# Patient Record
Sex: Female | Born: 1965
Health system: Southern US, Community
[De-identification: ages and names within clinical notes are randomized; demographics above are authoritative.]

## PROBLEM LIST (undated history)

## (undated) DIAGNOSIS — K635 Polyp of colon: Secondary | ICD-10-CM

## (undated) DIAGNOSIS — Z8371 Family history of colonic polyps: Secondary | ICD-10-CM

## (undated) DIAGNOSIS — N809 Endometriosis, unspecified: Secondary | ICD-10-CM

## (undated) DIAGNOSIS — I341 Nonrheumatic mitral (valve) prolapse: Secondary | ICD-10-CM

## (undated) DIAGNOSIS — M797 Fibromyalgia: Secondary | ICD-10-CM

## (undated) DIAGNOSIS — K648 Other hemorrhoids: Secondary | ICD-10-CM

## (undated) DIAGNOSIS — Z860100 Personal history of colon polyps, unspecified: Secondary | ICD-10-CM

## (undated) DIAGNOSIS — K219 Gastro-esophageal reflux disease without esophagitis: Secondary | ICD-10-CM

## (undated) DIAGNOSIS — E063 Autoimmune thyroiditis: Secondary | ICD-10-CM

## (undated) DIAGNOSIS — I10 Essential (primary) hypertension: Secondary | ICD-10-CM

## (undated) DIAGNOSIS — K449 Diaphragmatic hernia without obstruction or gangrene: Secondary | ICD-10-CM

## (undated) DIAGNOSIS — Z8619 Personal history of other infectious and parasitic diseases: Secondary | ICD-10-CM

## (undated) DIAGNOSIS — K5790 Diverticulosis of intestine, part unspecified, without perforation or abscess without bleeding: Secondary | ICD-10-CM

## (undated) DIAGNOSIS — D893 Immune reconstitution syndrome: Secondary | ICD-10-CM

## (undated) DIAGNOSIS — K589 Irritable bowel syndrome without diarrhea: Secondary | ICD-10-CM

## (undated) DIAGNOSIS — Z83719 Family history of colon polyps, unspecified: Secondary | ICD-10-CM

## (undated) DIAGNOSIS — F419 Anxiety disorder, unspecified: Secondary | ICD-10-CM

## (undated) DIAGNOSIS — E119 Type 2 diabetes mellitus without complications: Secondary | ICD-10-CM

## (undated) HISTORY — DX: Irritable bowel syndrome, unspecified: K58.9

## (undated) HISTORY — DX: Fibromyalgia: M79.7

## (undated) HISTORY — DX: Family history of colon polyps, unspecified: Z83.719

## (undated) HISTORY — DX: Personal history of other infectious and parasitic diseases: Z86.19

## (undated) HISTORY — DX: Endometriosis, unspecified: N80.9

## (undated) HISTORY — DX: Personal history of colon polyps, unspecified: Z86.0100

## (undated) HISTORY — DX: Family history of colonic polyps: Z83.71

## (undated) HISTORY — DX: Gastro-esophageal reflux disease without esophagitis: K21.9

## (undated) HISTORY — DX: Diaphragmatic hernia without obstruction or gangrene: K44.9

## (undated) HISTORY — DX: Type 2 diabetes mellitus without complications: E11.9

## (undated) HISTORY — DX: Autoimmune thyroiditis: E06.3

## (undated) HISTORY — DX: Polyp of colon: K63.5

## (undated) HISTORY — DX: Anxiety disorder, unspecified: F41.9

## (undated) HISTORY — DX: Personal history of colonic polyps: Z86.010

## (undated) HISTORY — DX: Essential (primary) hypertension: I10

## (undated) HISTORY — DX: Other hemorrhoids: K64.8

## (undated) HISTORY — DX: Immune reconstitution syndrome: D89.3

## (undated) HISTORY — DX: Diverticulosis of intestine, part unspecified, without perforation or abscess without bleeding: K57.90

## (undated) HISTORY — DX: Nonrheumatic mitral (valve) prolapse: I34.1

---
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
## 2003-03-24 ENCOUNTER — Other Ambulatory Visit: Admission: RE | Admit: 2003-03-24 | Discharge: 2003-03-24 | Payer: Self-pay | Admitting: Obstetrics and Gynecology

## 2005-03-02 ENCOUNTER — Other Ambulatory Visit: Admission: RE | Admit: 2005-03-02 | Discharge: 2005-03-02 | Payer: Self-pay | Admitting: Obstetrics and Gynecology

## 2005-08-04 ENCOUNTER — Encounter: Admission: RE | Admit: 2005-08-04 | Discharge: 2005-08-04 | Payer: Self-pay | Admitting: Otolaryngology

## 2005-11-24 ENCOUNTER — Inpatient Hospital Stay (HOSPITAL_COMMUNITY): Admission: AD | Admit: 2005-11-24 | Discharge: 2005-11-24 | Payer: Self-pay | Admitting: Obstetrics & Gynecology

## 2005-12-12 ENCOUNTER — Ambulatory Visit (HOSPITAL_COMMUNITY): Admission: RE | Admit: 2005-12-12 | Discharge: 2005-12-12 | Payer: Self-pay | Admitting: Obstetrics and Gynecology

## 2005-12-19 ENCOUNTER — Ambulatory Visit (HOSPITAL_COMMUNITY): Admission: RE | Admit: 2005-12-19 | Discharge: 2005-12-19 | Payer: Self-pay | Admitting: Obstetrics and Gynecology

## 2005-12-24 ENCOUNTER — Inpatient Hospital Stay (HOSPITAL_COMMUNITY): Admission: AD | Admit: 2005-12-24 | Discharge: 2005-12-24 | Payer: Self-pay | Admitting: Obstetrics and Gynecology

## 2005-12-25 ENCOUNTER — Ambulatory Visit (HOSPITAL_COMMUNITY): Admission: RE | Admit: 2005-12-25 | Discharge: 2005-12-25 | Payer: Self-pay | Admitting: Obstetrics and Gynecology

## 2006-05-25 IMAGING — US US OB COMP LESS 14 WK
1 series · 14 of 28 positions shown · non-contrast
Comparison: none

CLINICAL DATA: 7 week 4 day gestational age by LMP.  Quantitative beta hCG level of [DATE].  Question blighted ovum on office ultrasound.  
OBSTETRICAL ULTRASOUND <14 WKS AND TRANSVAGINAL OB US:
TECHNIQUE: Both transabdominal and transvaginal ultrasound examinations were performed for complete evaluation of the gestation as well as the maternal uterus, adnexal regions, and pelvic cul-de-sac.

[Series 1: us ob comp less 14 wk · 0.29mm/px · 14 of 48 slices shown]
[im 2/48]
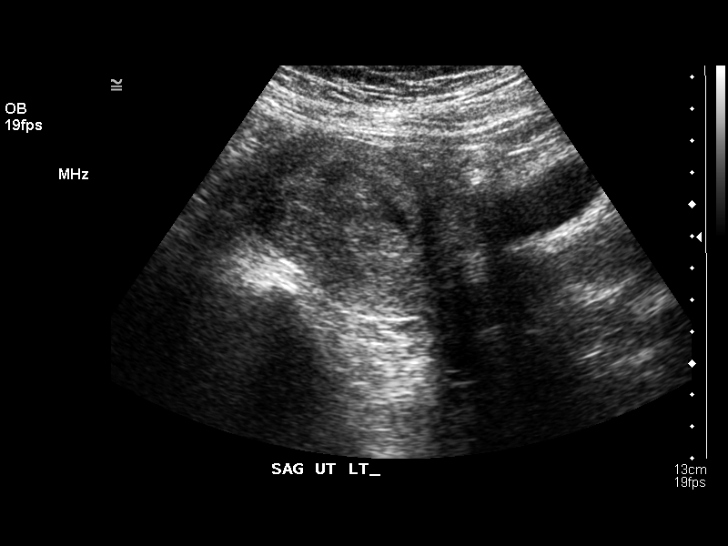
[im 6/48]
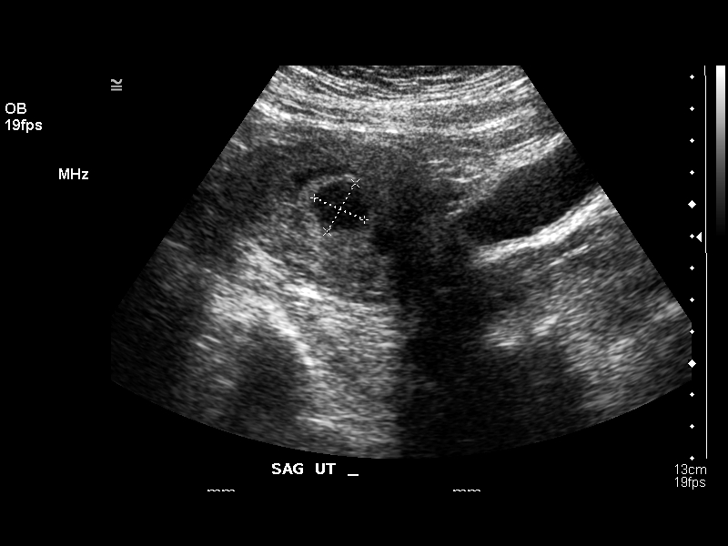
[im 9/48]
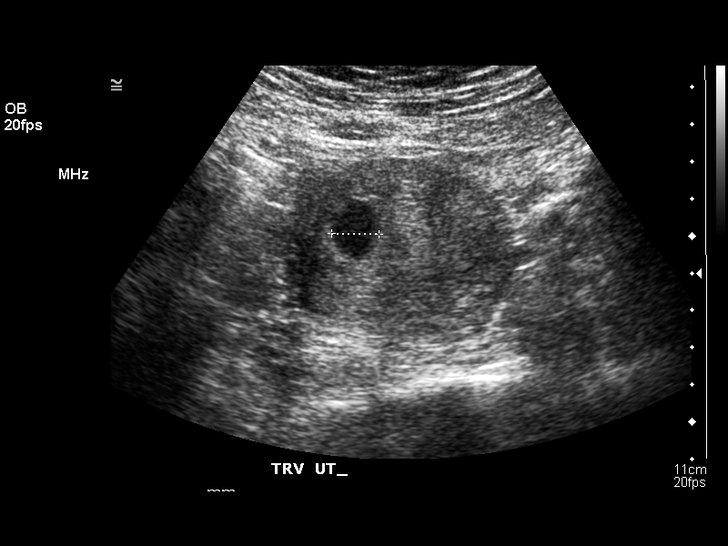
[im 13/48]
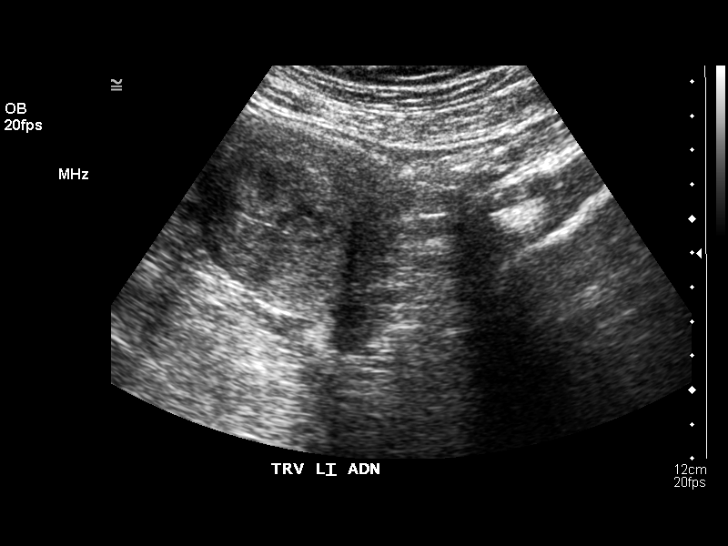
[im 16/48]
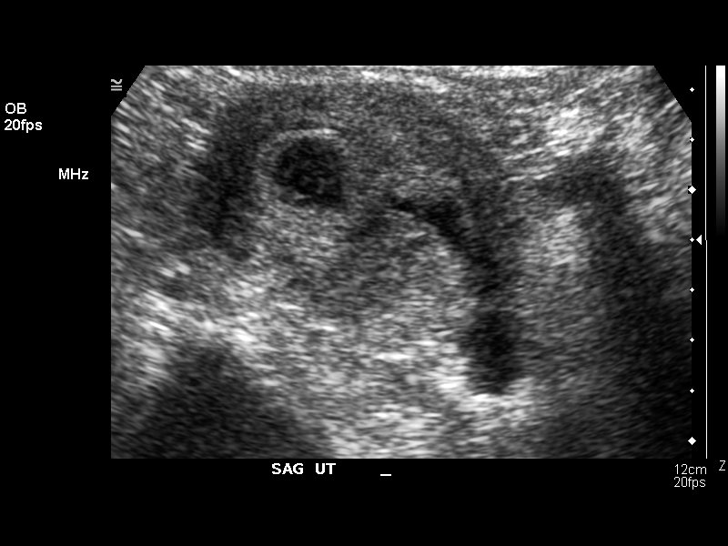
[im 20/48]
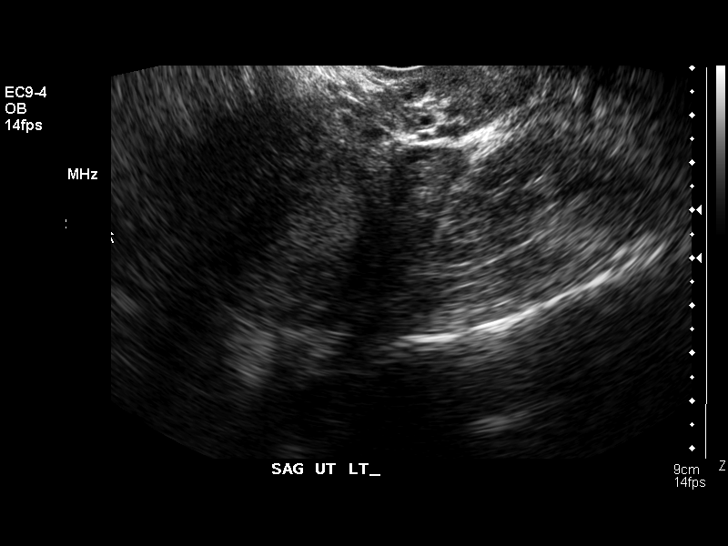
[im 23/48]
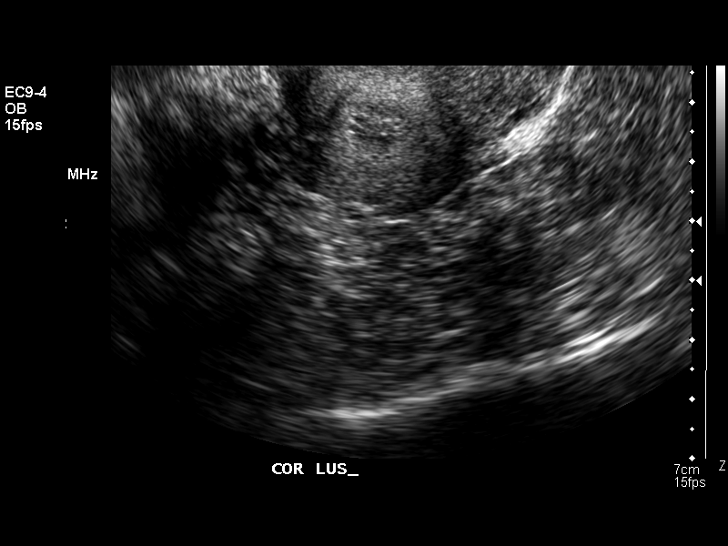
[im 27/48]
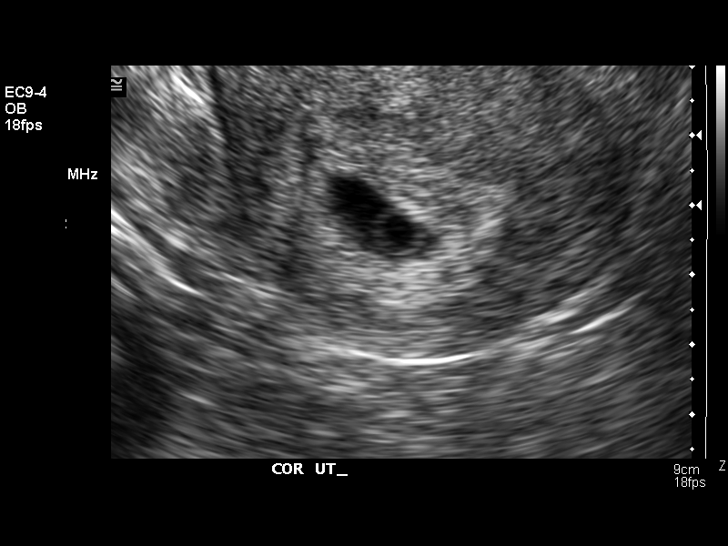
[im 30/48]
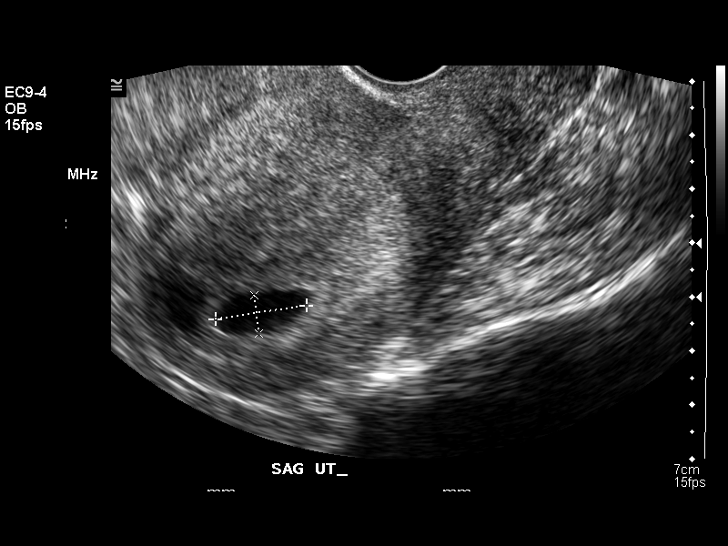
[im 34/48]
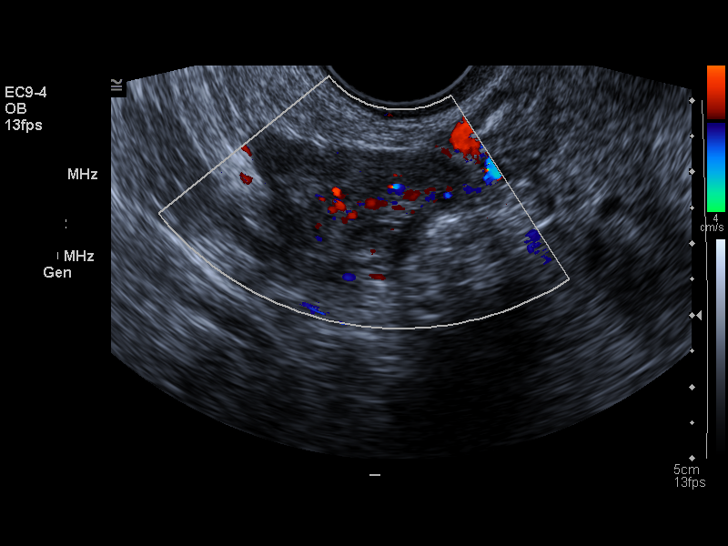
[im 37/48]
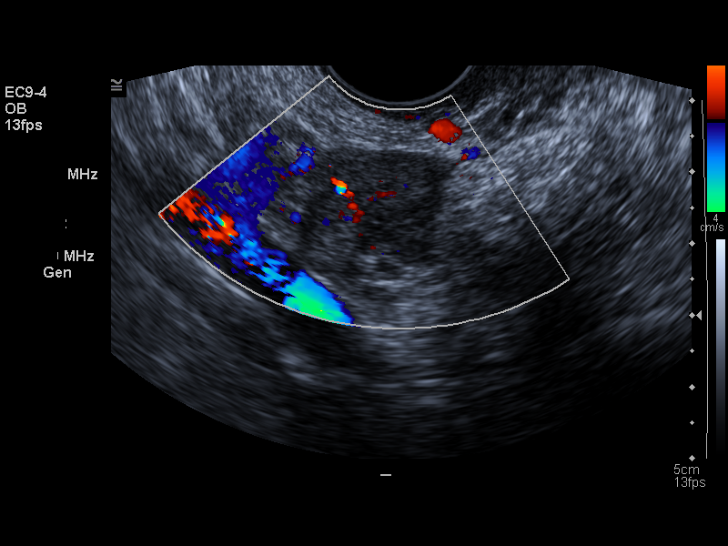
[im 41/48]
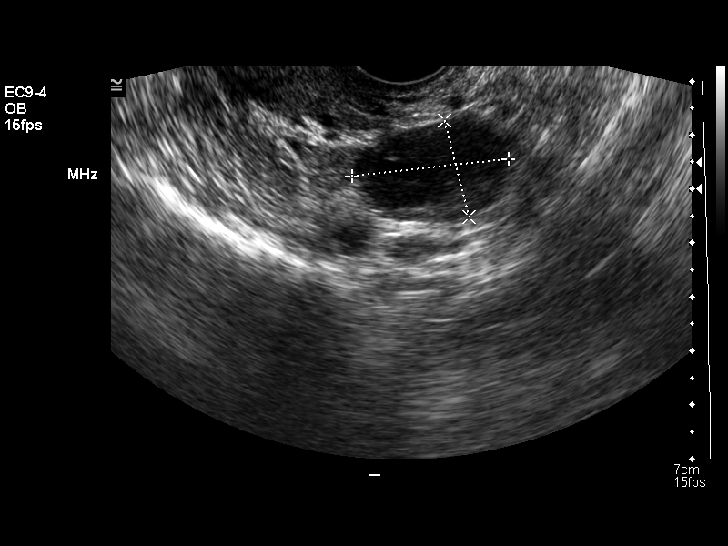
[im 44/48]
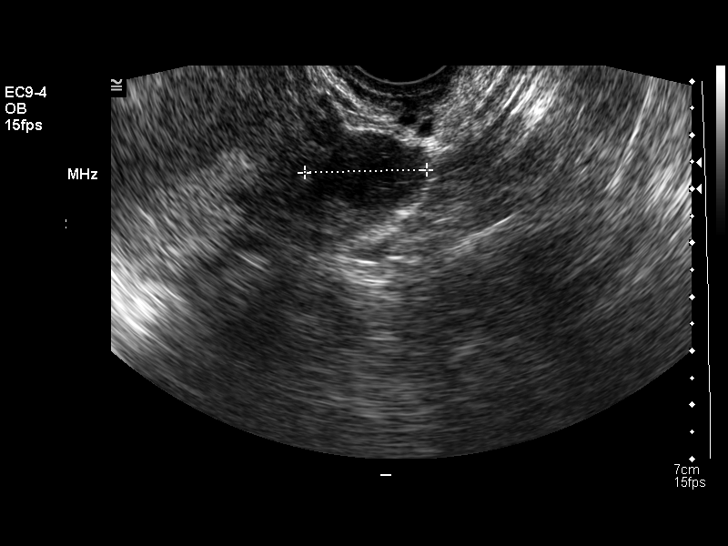
[im 48/48]
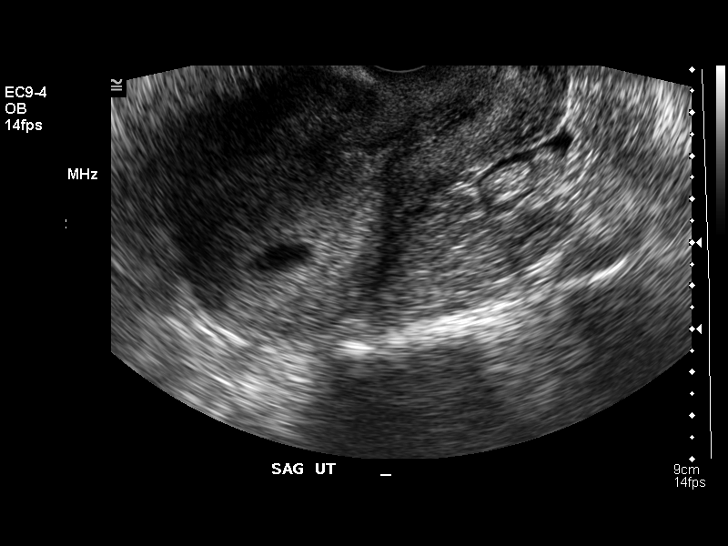

[14 of 28 positions shown; findings below may reference images not displayed]

FINDINGS: The uterus is retroverted.  A single intrauterine gestational sac is seen containing a yolk sac.  Mean sac diameter measures 15 mm, corresponding with a gestational age of 6 weeks 2 days.  A small subchorionic hemorrhage or implantation bleed is noted.  No fibroids or other uterine abnormalities are identified.
Both ovaries are visualized and are unremarkable in appearance.  There is no evidence of adnexal mass or free fluid.
IMPRESSION: 1.  Single intrauterine gestational sac with estimated gestational age of 6 weeks 2 days by mean sac diameter.  Ultrasound follow-up is recommended in 3 to 4 days for definitive assessment of viability.  
2.  No evidence of adnexal mass or free fluid.

## 2010-12-18 ENCOUNTER — Encounter: Payer: Self-pay | Admitting: Obstetrics and Gynecology

## 2015-04-09 HISTORY — PX: COLONOSCOPY: SHX174

## 2015-12-23 ENCOUNTER — Ambulatory Visit (INDEPENDENT_AMBULATORY_CARE_PROVIDER_SITE_OTHER): Payer: BLUE CROSS/BLUE SHIELD | Admitting: Allergy and Immunology

## 2015-12-23 ENCOUNTER — Encounter: Payer: Self-pay | Admitting: Allergy and Immunology

## 2015-12-23 VITALS — BP 128/86 | HR 80 | Temp 98.1°F | Resp 16 | Ht 65.59 in | Wt 187.4 lb

## 2015-12-23 DIAGNOSIS — K219 Gastro-esophageal reflux disease without esophagitis: Secondary | ICD-10-CM

## 2015-12-23 DIAGNOSIS — A09 Infectious gastroenteritis and colitis, unspecified: Secondary | ICD-10-CM | POA: Diagnosis not present

## 2015-12-23 DIAGNOSIS — R197 Diarrhea, unspecified: Secondary | ICD-10-CM

## 2015-12-23 DIAGNOSIS — J387 Other diseases of larynx: Secondary | ICD-10-CM

## 2015-12-23 DIAGNOSIS — J3089 Other allergic rhinitis: Secondary | ICD-10-CM | POA: Diagnosis not present

## 2015-12-23 DIAGNOSIS — M797 Fibromyalgia: Secondary | ICD-10-CM | POA: Diagnosis not present

## 2015-12-23 MED ORDER — RANITIDINE HCL 300 MG PO CAPS: 300.0000 mg | ORAL_CAPSULE | Freq: Every evening | ORAL | Status: DC

## 2015-12-23 MED ORDER — SUCRALFATE 1 G PO TABS: 1.0000 g | ORAL_TABLET | Freq: Three times a day (TID) | ORAL | Status: DC

## 2015-12-23 NOTE — Patient Instructions (Addendum)
  1. Treat reflux:   A. Nexium 40 mg in a.m.  B. ranitidine 300 mg in PM  C. Carafate 1 g tablet 4 times a day for 2 weeks  D. no caffeine and no chocolate  2. Obtain stool collection for C. difficile toxin and culture  3. Continue nasal fluticasone and antihistamine  4. Review results from previous blood testing in the past year  5. Further evaluation?  6. Call with update next week

## 2015-12-23 NOTE — Progress Notes (Addendum)
[facility]    NEW PATIENT NOTE  Referring Provider: No ref. provider found Primary Provider: Gilford Rile, MD Date of office visit: 12/23/2015    Subjective:   Chief Complaint:  Renee Vaughn is a 50 y.o. female with a chief complaint of Cough and Other  who presents to the clinic on 12/23/2015 with the following problems:  HPI Comments: Renee Vaughn presents this clinic in evaluation of an event that started on 11/23/2015. She has a long history of allergic disease for which I've seen her back in this clinic about a decade ago. She was doing relatively well up until December 27 at which time she developed a flulike illness with high fever and coughing and chills. She was evaluated by Dr. Bea Graff about 7 days later at which time she was flu swab negative and she was given azithromycin. She really didn't get much better over the course of the next 4 days and was evaluated in the urgent care center for coughing and dry barking quality and weakness. Apparently a chest x-ray was normal that point in time and she was given various inhalers and a short acting bronchodilator treatment in the office which did not really help her. When she got home she tried her son's nebulizer which did not help her. She developed very significant dry mouth is much worse after eating along with loss of throat clearing and feeling as though there is mucus stuck in her throat and posttussive emesis for which she was given Kenalog. She subsequently saw Dr. Lucia Gaskins who performed rhinoscopy which identified "swollen" throat. Presently she has dry mouth, throat clearing, mucus stuck in her throat, and a tickle stuck in her throat. She does not have any anosmia, ugly nasal discharge, or headaches. She is nauseated. She has noticed that she has very slow swallowing motility. She has watery yellow diarrhea ever since she took the Z-Pak. She's never had an upper endoscopy in evaluation  of her long-standing reflux treated with Nexium and ranitidine but she has had a colonoscopy performed by Dr. Lyndel Safe. She does have a history of allergic rhinitis for which she is using Flonase.   Past Medical History  Diagnosis Date  . Hypertension   . Anxiety     History reviewed. No pertinent past surgical history.   Current outpatient prescriptions:  .  esomeprazole (NEXIUM) 40 MG capsule, Take by mouth at bedtime., Disp: , Rfl:  .  fluticasone (FLONASE) 50 MCG/ACT nasal spray, Place 2 sprays into the nose daily. , Disp: , Rfl:  .  furosemide (LASIX) 20 MG tablet, Take by mouth as needed., Disp: , Rfl:  .  Mometasone Furoate (ASMANEX HFA) 100 MCG/ACT AERO, Inhale 2 puffs into the lungs daily., Disp: , Rfl:  .  Probiotic Product (PROBIOTIC PO), Take by mouth., Disp: , Rfl:  .  propranolol ER (INDERAL LA) 60 MG 24 hr capsule, 60 mg daily., Disp: , Rfl:  .  ranitidine (ZANTAC) 150 MG tablet, Take 150 mg by mouth 2 (two) times daily., Disp: , Rfl:  .  Spacer/Aero-Holding Chambers (VALVED HOLDING CHAMBER) DEVI, , Disp: , Rfl:  .  albuterol (PROVENTIL) (2.5 MG/3ML) 0.083% nebulizer solution, Reported on 12/23/2015, Disp: , Rfl:  .  ALPRAZolam (XANAX) 0.5 MG tablet, as needed. Reported on 12/23/2015, Disp: , Rfl:   No orders of the defined types were placed in this encounter.    Allergies  Allergen Reactions  . Dextromethorphan Other (See Comments)  .  Morphine And Related     Pt complained of palpitations, and chest pain. States that she just didn't feel well after taking it.  . Codeine Palpitations  . Levofloxacin Palpitations  . Moxifloxacin Hcl In Nacl Palpitations  . Sulfa Antibiotics Palpitations    Review of systems negative except as noted in HPI / PMHx or noted below:  Review of Systems  Constitutional: Negative.   HENT: Negative.   Eyes: Negative.   Respiratory: Negative.   Cardiovascular: Negative.   Gastrointestinal: Negative.   Genitourinary: Negative.    Musculoskeletal: Negative.   Skin: Negative.   Neurological: Negative.   Endo/Heme/Allergies: Negative.   Psychiatric/Behavioral: Negative.     Family History  Problem Relation Age of Onset  . Asthma Mother   . Diabetes Mother   . High blood pressure Mother   . COPD Mother   . Prolactinoma Mother   . Diabetes Father   . High blood pressure Father   . Asthma Sister   . Atrial fibrillation Maternal Grandmother   . Asthma Son   . Asthma Son   . GER disease Son   . Other Son     Eosinophilic Esophagitis    Social History   Social History  . Marital Status: Married    Spouse Name: N/A  . Number of Children: N/A  . Years of Education: N/A   Occupational History  . Not on file.   Social History Main Topics  . Smoking status: Never Smoker   . Smokeless tobacco: Never Used  . Alcohol Use: Not on file  . Drug Use: Not on file  . Sexual Activity: Not on file   Other Topics Concern  . Not on file   Social History Narrative  . No narrative on file    Environmental and Social history  Living in a house with a dry environment, a dog located inside the household, carpeting in the bedroom, no plastic on the bed or pillow, and no smokers located inside the household.   Objective:   Filed Vitals:   12/23/15 1501  BP: 128/86  Pulse: 80  Temp: 98.1 F (36.7 C)  Resp: 16   Height: 5' 5.59" (166.6 cm) Weight: 187 lb 6.3 oz (85 kg)  Physical Exam  Constitutional: She is well-developed, well-nourished, and in no distress.  Throat clearing  HENT:  Head: Normocephalic. Head is without right periorbital erythema and without left periorbital erythema.  Right Ear: Tympanic membrane, external ear and ear canal normal.  Left Ear: Tympanic membrane, external ear and ear canal normal.  Nose: Nose normal. No mucosal edema or rhinorrhea.  Mouth/Throat: Oropharynx is clear and moist and mucous membranes are normal. No oropharyngeal exudate.  Eyes: Conjunctivae and lids are  normal. Pupils are equal, round, and reactive to light.  Neck: Trachea normal. No tracheal deviation present. No thyromegaly present.  Cardiovascular: Normal rate, regular rhythm, S1 normal, S2 normal and normal heart sounds.   No murmur heard. Pulmonary/Chest: Effort normal. No stridor. No tachypnea. No respiratory distress. She has no wheezes. She has no rales. She exhibits no tenderness.  Abdominal: Soft. She exhibits no distension and no mass. There is no hepatosplenomegaly. There is no tenderness. There is no rebound and no guarding.  Musculoskeletal: She exhibits no edema or tenderness.  Lymphadenopathy:       Head (right side): No tonsillar adenopathy present.       Head (left side): No tonsillar adenopathy present.    She has no cervical adenopathy.  She has no axillary adenopathy.  Neurological: She is alert. Gait normal.  Skin: No rash noted. She is not diaphoretic. No erythema. No pallor. Nails show no clubbing.  Psychiatric: Mood and affect normal.     Diagnostics: none  Assessment and Plan:    1. LPRD (laryngopharyngeal reflux disease)   2. Diarrhea of presumed infectious origin   3. Fibromyalgia   4. Other allergic rhinitis     1. Treat reflux:   A. Nexium 40 mg in a.m.  B. ranitidine 300 mg in PM  C. Carafate 1 g tablet 4 times a day for 2 weeks  D. no caffeine and no chocolate  2. Obtain stool collection for C. difficile toxin and culture  3. Continue nasal fluticasone and antihistamine  4. Review results from previous blood testing in the past year  5. Further evaluation?  6. Call with update next week  Ivyanna apparently developed some type of viral infection that has inflamed her respiratory tract. She has a long history of laryngopharyngeal reflux which appears to have exacerbated as a function of this infection and she also appears to have developed rather significant GI upset in the face of this event. In addition, she now has diarrhea after using  azithromycin and we will screen her stool for possible C. difficile infection among other infections. Further evaluation and treatment will be based upon her response to medical therapy and the results of her diagnostic testing.   Allena Katz, MD [place]

## 2015-12-26 LAB — CLOSTRIDIUM DIFFICILE EIA: C difficile Toxins A+B, EIA: NEGATIVE

## 2015-12-27 ENCOUNTER — Ambulatory Visit: Payer: Self-pay | Admitting: Allergy and Immunology

## 2015-12-29 ENCOUNTER — Telehealth: Payer: Self-pay

## 2015-12-29 LAB — STOOL CULTURE: E COLI SHIGA TOXIN ASSAY: NEGATIVE

## 2015-12-29 NOTE — Telephone Encounter (Signed)
Patient called with update.  Her stomach is feeling better, however she is still struggling with mucus in her throat. She has been using the new medications as directed.  She did have an abdominal ultrasound. Will print out results for Dr. Neldon Mc to review. Please advise.

## 2015-12-30 ENCOUNTER — Telehealth: Payer: Self-pay | Admitting: *Deleted

## 2015-12-30 NOTE — Telephone Encounter (Signed)
Continue treatment until return to clinic. Will discuss at that visit.

## 2015-12-30 NOTE — Telephone Encounter (Signed)
Patient informed. Will continue treatment and come see Dr.Kozlow next week.

## 2015-12-30 NOTE — Telephone Encounter (Signed)
Pt. States that she is still having a lot of mucus production in her throat. Should she come back to see you or should she see ENT?

## 2015-12-30 NOTE — Telephone Encounter (Signed)
Scheduled for February 9th at 3:45 pm.

## 2016-01-05 ENCOUNTER — Encounter: Payer: Self-pay | Admitting: *Deleted

## 2016-01-06 ENCOUNTER — Ambulatory Visit: Payer: BLUE CROSS/BLUE SHIELD | Admitting: Allergy and Immunology

## 2016-01-17 ENCOUNTER — Encounter: Payer: Self-pay | Admitting: *Deleted

## 2016-07-06 ENCOUNTER — Ambulatory Visit (INDEPENDENT_AMBULATORY_CARE_PROVIDER_SITE_OTHER): Payer: BLUE CROSS/BLUE SHIELD | Admitting: Allergy and Immunology

## 2016-07-06 ENCOUNTER — Encounter: Payer: Self-pay | Admitting: Allergy and Immunology

## 2016-07-06 VITALS — BP 120/72 | HR 80 | Resp 18

## 2016-07-06 DIAGNOSIS — J309 Allergic rhinitis, unspecified: Secondary | ICD-10-CM | POA: Diagnosis not present

## 2016-07-06 DIAGNOSIS — R42 Dizziness and giddiness: Secondary | ICD-10-CM

## 2016-07-06 DIAGNOSIS — J387 Other diseases of larynx: Secondary | ICD-10-CM

## 2016-07-06 DIAGNOSIS — H1010 Acute atopic conjunctivitis, unspecified eye: Secondary | ICD-10-CM

## 2016-07-06 DIAGNOSIS — K219 Gastro-esophageal reflux disease without esophagitis: Secondary | ICD-10-CM

## 2016-07-06 MED ORDER — MONTELUKAST SODIUM 10 MG PO TABS: 10.0000 mg | ORAL_TABLET | Freq: Every day | ORAL | 5 refills | Status: DC

## 2016-07-06 NOTE — Progress Notes (Signed)
Follow-up Note  Referring Provider: Raina Mina., MD Primary Provider: Gilford Rile, MD Date of Office Visit: 07/06/2016  Subjective:   Renee Vaughn (DOB: 1966-01-09) is a 50 y.o. female who returns to the [facility] on 07/06/2016 in re-evaluation of the following:  HPI: [name] presents to this clinic in evaluation of multiple issues. I last saw her in his clinic on 12/23/2015 at which time she was being followed in this clinic for issues with allergic rhinoconjunctivitis and LPR.  She still continues to have issues with upper airway inflammation manifested as nasal congestion and sneezing and some occasional episodes of sinusitis. She had a head CT scan performed at [facility] on 2016 2017 which really did not identify any significant amount of chronic sinusitis but did identify nasopharyngeal mucosal thickening. She continues to use a nasal steroid on a regular basis as well as Claritin. She is interested in starting a course of immunotherapy.  [name] also has problems with chronic vestibular symptoms. She has vertigo and nausea and sometimes these issues prevented her from functioning very well. Interestingly, she gets a flare of this issue whenever she has any type of anesthetic. For instance, she had a ERCP with sphincterotomy back in February of this year and for approximately 6-8 weeks she had constant dizziness and nausea and brain fog and could really function very well. A similar type of issue happened 30 years ago when she received a general anesthetic. She will take meclizine on occasion but this doesn't seem to work particularly that well with the problem of the delay in which this medication starts to provide a benefit. She has seen a neurologist and a ENT for this issue. There may be the diagnosis of chronic fatigue and a neuro immune disorder but there does not appear to be an issue of myasthenia gravis or multiple sclerosis contributing to her  symptoms.  She has gastroesophageal reflux disease and LPR that she thinks is under relatively good control this point in time while consistently using her Nexium and ranitidine. She appears to be suffering from recurrent problems with a dysfunctional gallbladder and at some point in the near future she is considering having her gallbladder removed.    Medication List      esomeprazole 40 MG capsule Commonly known as:  NEXIUM Take by mouth at bedtime.   furosemide 20 MG tablet Commonly known as:  LASIX Take by mouth as needed.   mometasone 50 MCG/ACT nasal spray Commonly known as:  NASONEX Place 2 sprays into the nose daily.   PROBIOTIC PO Take by mouth.   propranolol ER 60 MG 24 hr capsule Commonly known as:  INDERAL LA 60 mg daily.   ranitidine 300 MG capsule Commonly known as:  ZANTAC Take 1 capsule (300 mg total) by mouth every evening.   sucralfate 1 g tablet Commonly known as:  CARAFATE Take 1 tablet (1 g total) by mouth 4 (four) times daily -  with meals and at bedtime. Use for 2 weeks.       Past Medical History:  Diagnosis Date  . Anxiety   . Colon polyps   . Diverticulosis   . GERD (gastroesophageal reflux disease)   . Hiatal hernia   . Hypertension     Past Surgical History:  Procedure Laterality Date  . episiotomy  1984   During childbirth.     Allergies  Allergen Reactions  . Dextromethorphan Other (See Comments)  . Morphine And Related  Pt complained of palpitations, and chest pain. States that she just didn't feel well after taking it.  . Codeine Palpitations  . Levofloxacin Palpitations  . Moxifloxacin Hcl In Nacl Palpitations  . Sulfa Antibiotics Palpitations    Review of systems negative except as noted in HPI / PMHx or noted below:  Review of Systems  Constitutional: Negative.   HENT: Negative.   Eyes: Negative.   Respiratory: Negative.   Cardiovascular: Negative.   Gastrointestinal: Negative.   Genitourinary: Negative.    Musculoskeletal: Negative.   Skin: Negative.   Neurological: Negative.   Endo/Heme/Allergies: Negative.   Psychiatric/Behavioral: Negative.      Objective:   Vitals:   07/06/16 0845  BP: 120/72  Pulse: 80  Resp: 18          Physical Exam  Constitutional: She is well-developed, well-nourished, and in no distress.  HENT:  Head: Normocephalic.  Right Ear: Tympanic membrane, external ear and ear canal normal.  Left Ear: Tympanic membrane, external ear and ear canal normal.  Nose: Nose normal. No mucosal edema or rhinorrhea.  Mouth/Throat: Uvula is midline, oropharynx is clear and moist and mucous membranes are normal. No oropharyngeal exudate.  Eyes: Conjunctivae are normal.  Neck: Trachea normal. No tracheal tenderness present. No tracheal deviation present. No thyromegaly present.  Cardiovascular: Normal rate, regular rhythm, S1 normal, S2 normal and normal heart sounds.   No murmur heard. Pulmonary/Chest: Breath sounds normal. No stridor. No respiratory distress. She has no wheezes. She has no rales.  Musculoskeletal: She exhibits no edema.  Lymphadenopathy:       Head (right side): No tonsillar adenopathy present.       Head (left side): No tonsillar adenopathy present.    She has no cervical adenopathy.  Neurological: She is alert. Gait normal.  Skin: No rash noted. She is not diaphoretic. No erythema. Nails show no clubbing.  Psychiatric: Mood and affect normal.    Diagnostics: Allergy skin testing was performed.She demonstrated hypersensitivity to house dust mite    Assessment and Plan:   1. Allergic rhinoconjunctivitis   2. LPRD (laryngopharyngeal reflux disease)   3. Vertigo     1. Treat reflux:   A. Nexium 40 mg in a.m.  B. ranitidine 300 mg in PM  C. Carafate 1 g tablet 4 times if needed  D. no caffeine and no chocolate  2. Start montelukast 10 mg one tablet daily  3. Continue Nasonex and antihistamine  4. Consider using meclizine 12.5 mg twice  a day on a consistent basis  5. Consider a course of immunotherapy  6. Obtain fall flu vaccine  7. Return to clinic in 6 months or earlier if problem  Cecilie will perform allergen avoidance measures and continue on Nasonex and start montelukast and start a course of immunotherapy to address her atopic respiratory disease. As well, I made a recommendation that she consider using meclizine on a chronic basis using 12.5 mg twice a day to address her vertigo. She'll continue to use aggressive therapy directed against reflux to address her LPR. I'll see her in his clinic in 6 months or earlier if there is a problem. Allena Katz, MD [place]

## 2016-07-06 NOTE — Patient Instructions (Addendum)
  1. Treat reflux:   A. Nexium 40 mg in a.m.  B. ranitidine 300 mg in PM  C. Carafate 1 g tablet 4 times if needed  D. no caffeine and no chocolate  2.  Start montelukast 10 mg one tablet daily  3. Continue Nasonex and antihistamine  4. Consider using meclizine 12.5 mg twice a day on a consistent basis  5. Consider a course of immunotherapy  6. Obtain fall flu vaccine  7. Return to clinic in 6 months or earlier if problem

## 2016-07-13 ENCOUNTER — Other Ambulatory Visit: Payer: Self-pay | Admitting: Allergy and Immunology

## 2016-07-13 DIAGNOSIS — H1010 Acute atopic conjunctivitis, unspecified eye: Secondary | ICD-10-CM

## 2016-07-13 DIAGNOSIS — J3089 Other allergic rhinitis: Secondary | ICD-10-CM | POA: Diagnosis not present

## 2016-07-13 DIAGNOSIS — J309 Allergic rhinitis, unspecified: Secondary | ICD-10-CM

## 2016-07-24 ENCOUNTER — Encounter: Payer: Self-pay | Admitting: *Deleted

## 2016-07-24 ENCOUNTER — Ambulatory Visit (INDEPENDENT_AMBULATORY_CARE_PROVIDER_SITE_OTHER): Payer: BLUE CROSS/BLUE SHIELD | Admitting: *Deleted

## 2016-07-24 DIAGNOSIS — J309 Allergic rhinitis, unspecified: Secondary | ICD-10-CM | POA: Diagnosis not present

## 2016-07-24 NOTE — Progress Notes (Signed)
This encounter was created in error - please disregard.

## 2016-07-24 NOTE — Progress Notes (Signed)
Patient started allergy injections Blue 100,000 at .05 dosage Dust Mite. Reviewed side effects, injection schedule B 1-2 times weekly, hours and how/when to use Epipen patient advised has one on hand at home

## 2016-07-27 ENCOUNTER — Ambulatory Visit (INDEPENDENT_AMBULATORY_CARE_PROVIDER_SITE_OTHER): Payer: BLUE CROSS/BLUE SHIELD | Admitting: *Deleted

## 2016-07-27 DIAGNOSIS — J309 Allergic rhinitis, unspecified: Secondary | ICD-10-CM

## 2016-08-04 ENCOUNTER — Ambulatory Visit (INDEPENDENT_AMBULATORY_CARE_PROVIDER_SITE_OTHER): Payer: BLUE CROSS/BLUE SHIELD | Admitting: *Deleted

## 2016-08-04 DIAGNOSIS — J309 Allergic rhinitis, unspecified: Secondary | ICD-10-CM

## 2016-08-07 ENCOUNTER — Ambulatory Visit (INDEPENDENT_AMBULATORY_CARE_PROVIDER_SITE_OTHER): Payer: BLUE CROSS/BLUE SHIELD | Admitting: *Deleted

## 2016-08-07 DIAGNOSIS — J309 Allergic rhinitis, unspecified: Secondary | ICD-10-CM | POA: Diagnosis not present

## 2016-08-14 ENCOUNTER — Ambulatory Visit (INDEPENDENT_AMBULATORY_CARE_PROVIDER_SITE_OTHER): Payer: BLUE CROSS/BLUE SHIELD | Admitting: *Deleted

## 2016-08-14 DIAGNOSIS — J309 Allergic rhinitis, unspecified: Secondary | ICD-10-CM | POA: Diagnosis not present

## 2016-08-18 ENCOUNTER — Ambulatory Visit (INDEPENDENT_AMBULATORY_CARE_PROVIDER_SITE_OTHER): Payer: BLUE CROSS/BLUE SHIELD | Admitting: *Deleted

## 2016-08-18 DIAGNOSIS — J309 Allergic rhinitis, unspecified: Secondary | ICD-10-CM

## 2016-09-01 ENCOUNTER — Ambulatory Visit (INDEPENDENT_AMBULATORY_CARE_PROVIDER_SITE_OTHER): Payer: BLUE CROSS/BLUE SHIELD | Admitting: *Deleted

## 2016-09-01 DIAGNOSIS — J309 Allergic rhinitis, unspecified: Secondary | ICD-10-CM | POA: Diagnosis not present

## 2016-09-07 ENCOUNTER — Ambulatory Visit (INDEPENDENT_AMBULATORY_CARE_PROVIDER_SITE_OTHER): Payer: BLUE CROSS/BLUE SHIELD | Admitting: *Deleted

## 2016-09-07 DIAGNOSIS — J309 Allergic rhinitis, unspecified: Secondary | ICD-10-CM

## 2016-09-11 ENCOUNTER — Ambulatory Visit (INDEPENDENT_AMBULATORY_CARE_PROVIDER_SITE_OTHER): Payer: BLUE CROSS/BLUE SHIELD | Admitting: *Deleted

## 2016-09-11 DIAGNOSIS — J309 Allergic rhinitis, unspecified: Secondary | ICD-10-CM | POA: Diagnosis not present

## 2016-09-18 ENCOUNTER — Ambulatory Visit (INDEPENDENT_AMBULATORY_CARE_PROVIDER_SITE_OTHER): Payer: BLUE CROSS/BLUE SHIELD | Admitting: *Deleted

## 2016-09-18 DIAGNOSIS — J309 Allergic rhinitis, unspecified: Secondary | ICD-10-CM | POA: Diagnosis not present

## 2016-09-29 ENCOUNTER — Ambulatory Visit (INDEPENDENT_AMBULATORY_CARE_PROVIDER_SITE_OTHER): Payer: BLUE CROSS/BLUE SHIELD

## 2016-09-29 DIAGNOSIS — J309 Allergic rhinitis, unspecified: Secondary | ICD-10-CM

## 2016-10-06 ENCOUNTER — Ambulatory Visit (INDEPENDENT_AMBULATORY_CARE_PROVIDER_SITE_OTHER): Payer: BLUE CROSS/BLUE SHIELD

## 2016-10-06 DIAGNOSIS — J309 Allergic rhinitis, unspecified: Secondary | ICD-10-CM | POA: Diagnosis not present

## 2016-10-13 ENCOUNTER — Ambulatory Visit (INDEPENDENT_AMBULATORY_CARE_PROVIDER_SITE_OTHER): Payer: BLUE CROSS/BLUE SHIELD

## 2016-10-13 DIAGNOSIS — J309 Allergic rhinitis, unspecified: Secondary | ICD-10-CM | POA: Diagnosis not present

## 2016-10-16 ENCOUNTER — Ambulatory Visit (INDEPENDENT_AMBULATORY_CARE_PROVIDER_SITE_OTHER): Payer: BLUE CROSS/BLUE SHIELD | Admitting: *Deleted

## 2016-10-16 DIAGNOSIS — J309 Allergic rhinitis, unspecified: Secondary | ICD-10-CM

## 2016-11-02 ENCOUNTER — Ambulatory Visit (INDEPENDENT_AMBULATORY_CARE_PROVIDER_SITE_OTHER): Payer: BLUE CROSS/BLUE SHIELD

## 2016-11-02 DIAGNOSIS — J309 Allergic rhinitis, unspecified: Secondary | ICD-10-CM

## 2016-11-13 ENCOUNTER — Ambulatory Visit (INDEPENDENT_AMBULATORY_CARE_PROVIDER_SITE_OTHER): Payer: BLUE CROSS/BLUE SHIELD | Admitting: *Deleted

## 2016-11-13 DIAGNOSIS — J309 Allergic rhinitis, unspecified: Secondary | ICD-10-CM

## 2016-12-04 ENCOUNTER — Ambulatory Visit (INDEPENDENT_AMBULATORY_CARE_PROVIDER_SITE_OTHER): Payer: BLUE CROSS/BLUE SHIELD | Admitting: *Deleted

## 2016-12-04 DIAGNOSIS — J309 Allergic rhinitis, unspecified: Secondary | ICD-10-CM

## 2016-12-05 ENCOUNTER — Other Ambulatory Visit: Payer: Self-pay | Admitting: *Deleted

## 2016-12-05 MED ORDER — MOMETASONE FUROATE 50 MCG/ACT NA SUSP
2.0000 | Freq: Every day | NASAL | 1 refills | Status: DC
Start: 2016-12-05 — End: 2017-07-03

## 2016-12-08 ENCOUNTER — Ambulatory Visit (INDEPENDENT_AMBULATORY_CARE_PROVIDER_SITE_OTHER): Payer: BLUE CROSS/BLUE SHIELD | Admitting: Allergy and Immunology

## 2016-12-08 ENCOUNTER — Encounter: Payer: Self-pay | Admitting: Allergy and Immunology

## 2016-12-08 VITALS — BP 148/84 | HR 80 | Temp 98.3°F | Resp 18

## 2016-12-08 DIAGNOSIS — J0120 Acute ethmoidal sinusitis, unspecified: Secondary | ICD-10-CM | POA: Diagnosis not present

## 2016-12-08 DIAGNOSIS — K219 Gastro-esophageal reflux disease without esophagitis: Secondary | ICD-10-CM | POA: Diagnosis not present

## 2016-12-08 DIAGNOSIS — H1010 Acute atopic conjunctivitis, unspecified eye: Secondary | ICD-10-CM

## 2016-12-08 DIAGNOSIS — J309 Allergic rhinitis, unspecified: Secondary | ICD-10-CM

## 2016-12-08 MED ORDER — AMOXICILLIN-POT CLAVULANATE 875-125 MG PO TABS: ORAL_TABLET | ORAL | 0 refills | Status: DC

## 2016-12-08 NOTE — Progress Notes (Signed)
Follow-up Note  Referring Provider: Raina Mina., MD Primary Provider: Gilford Rile, MD Date of Office Visit: 12/08/2016  Subjective:   Renee Vaughn (DOB: January 11, 1966) is a 51 y.o. female who returns to the [facility] on 12/08/2016 in re-evaluation of the following:  HPI: Renee Vaughn presents to this clinic with a 14 day history of head fullness and frontal headache and headache behind her eyes and anosmia and postnasal drip and yellow bloody nasal discharge as well as slight cough. Apparently this initially started on December 29 along with other members in the family having a similar type of issue. However, she is not improving and in fact may be getting worse.  Renee Vaughn is undergoing a course of immunotherapy which has been going quite well. Prior to this event she was doing very well regarding all of her upper airway symptoms while consistently using a nasal steroid. She did have a problem with vertigo that for the most part has resolved. Her reflux is under excellent control with her current therapy of Nexium and ranitidine.  Allergies as of 12/08/2016      Reactions   Dextromethorphan Other (See Comments)   Morphine And Related    Pt complained of palpitations, and chest pain. States that she just didn't feel well after taking it.   Codeine Palpitations   Levofloxacin Palpitations   Moxifloxacin Hcl In Nacl Palpitations   Sulfa Antibiotics Palpitations      Medication List      esomeprazole 40 MG capsule Commonly known as:  NEXIUM Take by mouth at bedtime.   furosemide 20 MG tablet Commonly known as:  LASIX Take by mouth as needed.   mometasone 50 MCG/ACT nasal spray Commonly known as:  NASONEX Place 2 sprays into the nose daily.   PROBIOTIC PO Take by mouth.   propranolol ER 60 MG 24 hr capsule Commonly known as:  INDERAL LA 60 mg daily.   ranitidine 150 MG capsule Commonly known as:  ZANTAC Take by mouth.   ranitidine 300 MG capsule Commonly  known as:  ZANTAC Take 1 capsule (300 mg total) by mouth every evening.       Past Medical History:  Diagnosis Date  . Anxiety   . Colon polyps   . Diverticulosis   . GERD (gastroesophageal reflux disease)   . Hiatal hernia   . Hypertension     Past Surgical History:  Procedure Laterality Date  . episiotomy  1984   During childbirth.     Review of systems negative except as noted in HPI / PMHx or noted below:  Review of Systems  Constitutional: Negative.   HENT: Negative.   Eyes: Negative.   Respiratory: Negative.   Cardiovascular: Negative.   Gastrointestinal: Negative.   Genitourinary: Negative.   Musculoskeletal: Negative.   Skin: Negative.   Neurological: Negative.   Endo/Heme/Allergies: Negative.   Psychiatric/Behavioral: Negative.      Objective:   Vitals:   12/08/16 1012  BP: (!) 148/84  Pulse: 80  Resp: 18  Temp: 98.3 F (36.8 C)          Physical Exam  Constitutional: She is well-developed, well-nourished, and in no distress.  HENT:  Head: Normocephalic.  Right Ear: Tympanic membrane, external ear and ear canal normal.  Left Ear: Tympanic membrane, external ear and ear canal normal.  Nose: Nose normal. No mucosal edema or rhinorrhea.  Mouth/Throat: Uvula is midline, oropharynx is clear and moist and mucous membranes are normal. No oropharyngeal  exudate.  Eyes: Conjunctivae are normal.  Neck: Trachea normal. No tracheal tenderness present. No tracheal deviation present. No thyromegaly present.  Cardiovascular: Normal rate, regular rhythm, S1 normal, S2 normal and normal heart sounds.   No murmur heard. Pulmonary/Chest: Breath sounds normal. No stridor. No respiratory distress. She has no wheezes. She has no rales.  Musculoskeletal: She exhibits no edema.  Lymphadenopathy:       Head (right side): No tonsillar adenopathy present.       Head (left side): No tonsillar adenopathy present.    She has cervical adenopathy (less than 1 cm diameter  nontender mobile lateral neck lymph node).  Neurological: She is alert. Gait normal.  Skin: No rash noted. She is not diaphoretic. No erythema. Nails show no clubbing.  Psychiatric: Mood and affect normal.    Diagnostics: none   Assessment and Plan:   1. Allergic rhinoconjunctivitis   2. Acute non-recurrent ethmoidal sinusitis   3. LPRD (laryngopharyngeal reflux disease)     1. Continue to treat reflux with Nexium 40 mg in a.m. and ranitidine 300 mg in PM    2.  Continue Nasonex and antihistamine  3. Continue immunotherapy and Epi-Pen  4. Augmentin 875 one tablet two times per day for 14 days  5. Prednisone 10mg  one tablet one time per day for 4 days  6. Lymph node resolution?  7. Return to clinic in 6 months or earlier if problem  Kawthar apparently has a respiratory tract infection and I will treat her with the therapy mentioned above and assume she'll do well. I've asked her to keep an assessment of her left lymph node enlargement and we need to make sure that this does resolve over the course of the next several weeks. She'll continue on immunotherapy and nasal steroids for her atopic respiratory disease and also continue to treat reflux as noted above. I'll see her back in this clinic in 6 months or earlier if there is a problem.  Allena Katz, MD [place]

## 2016-12-08 NOTE — Patient Instructions (Addendum)
  1. Continue to treat reflux with Nexium 40 mg in a.m. and ranitidine 300 mg in PM    2.  Continue Nasonex and antihistamine  3. Continue immunotherapy and Epi-Pen  4. Augmentin 875 one tablet two times per day for 14 days  5. Prednisone 10mg  one tablet one time per day for 4 days  6. Lymph node resolution?  7. Return to clinic in 6 months or earlier if problem

## 2016-12-18 ENCOUNTER — Ambulatory Visit (INDEPENDENT_AMBULATORY_CARE_PROVIDER_SITE_OTHER): Payer: BLUE CROSS/BLUE SHIELD | Admitting: *Deleted

## 2016-12-18 DIAGNOSIS — J309 Allergic rhinitis, unspecified: Secondary | ICD-10-CM

## 2016-12-29 ENCOUNTER — Ambulatory Visit (INDEPENDENT_AMBULATORY_CARE_PROVIDER_SITE_OTHER): Payer: BLUE CROSS/BLUE SHIELD | Admitting: *Deleted

## 2016-12-29 DIAGNOSIS — J309 Allergic rhinitis, unspecified: Secondary | ICD-10-CM

## 2017-01-08 ENCOUNTER — Ambulatory Visit: Payer: BLUE CROSS/BLUE SHIELD | Admitting: Allergy and Immunology

## 2017-01-08 ENCOUNTER — Ambulatory Visit (INDEPENDENT_AMBULATORY_CARE_PROVIDER_SITE_OTHER): Payer: BLUE CROSS/BLUE SHIELD | Admitting: *Deleted

## 2017-01-08 DIAGNOSIS — J309 Allergic rhinitis, unspecified: Secondary | ICD-10-CM | POA: Diagnosis not present

## 2017-01-25 ENCOUNTER — Ambulatory Visit (INDEPENDENT_AMBULATORY_CARE_PROVIDER_SITE_OTHER): Payer: BLUE CROSS/BLUE SHIELD | Admitting: *Deleted

## 2017-01-25 DIAGNOSIS — J309 Allergic rhinitis, unspecified: Secondary | ICD-10-CM | POA: Diagnosis not present

## 2017-02-02 ENCOUNTER — Ambulatory Visit (INDEPENDENT_AMBULATORY_CARE_PROVIDER_SITE_OTHER): Payer: BLUE CROSS/BLUE SHIELD | Admitting: *Deleted

## 2017-02-02 DIAGNOSIS — J309 Allergic rhinitis, unspecified: Secondary | ICD-10-CM

## 2017-02-16 ENCOUNTER — Ambulatory Visit (INDEPENDENT_AMBULATORY_CARE_PROVIDER_SITE_OTHER): Payer: BLUE CROSS/BLUE SHIELD

## 2017-02-16 DIAGNOSIS — J309 Allergic rhinitis, unspecified: Secondary | ICD-10-CM | POA: Diagnosis not present

## 2017-02-19 ENCOUNTER — Ambulatory Visit: Payer: BLUE CROSS/BLUE SHIELD | Admitting: Allergy and Immunology

## 2017-02-26 ENCOUNTER — Ambulatory Visit (INDEPENDENT_AMBULATORY_CARE_PROVIDER_SITE_OTHER): Payer: BLUE CROSS/BLUE SHIELD | Admitting: *Deleted

## 2017-02-26 DIAGNOSIS — J309 Allergic rhinitis, unspecified: Secondary | ICD-10-CM | POA: Diagnosis not present

## 2017-03-08 ENCOUNTER — Encounter: Payer: Self-pay | Admitting: Allergy and Immunology

## 2017-03-08 ENCOUNTER — Ambulatory Visit (INDEPENDENT_AMBULATORY_CARE_PROVIDER_SITE_OTHER): Payer: BLUE CROSS/BLUE SHIELD | Admitting: Allergy and Immunology

## 2017-03-08 VITALS — BP 140/92 | HR 80 | Resp 16

## 2017-03-08 DIAGNOSIS — K219 Gastro-esophageal reflux disease without esophagitis: Secondary | ICD-10-CM | POA: Diagnosis not present

## 2017-03-08 DIAGNOSIS — J3089 Other allergic rhinitis: Secondary | ICD-10-CM

## 2017-03-09 NOTE — Progress Notes (Signed)
Follow-up Note  Referring Provider: Raina Mina., MD Primary Provider: Gilford Rile, MD Date of Office Visit: 03/08/2017  Subjective:   Renee Vaughn (DOB: Nov 22, 1966) is a 51 y.o. female who returns to the [facility] [place] on 03/08/2017 in re-evaluation of the following:  HPI: Renee Vaughn returns to this clinic in reevaluation of her allergic rhinitis and LPR. I have not seen her in this clinic since January 2018.  She continues to do well without any need for a systemic steroid or an antibiotic to treat any type of respiratory tract problem. She has been consistently using a nasal steroid and has no problems with her airway or with significant cough.  Her reflux is under excellent control and she has very little issues with her throat. She uses Nexium every day and occasionally will add in ranitidine.  Her immunotherapy is going quite well. She believes that this has really helped her significantly. She has not had any adverse effect secondary to this therapy.  She did have a lymph node in her left cervical area during her last visit and she was instructed to follow the size of this lymph node and apparently it has resolved.  Allergies as of 03/08/2017      Reactions   Dextromethorphan Other (See Comments)   Morphine And Related    Pt complained of palpitations, and chest pain. States that she just didn't feel well after taking it.   Codeine Palpitations   Levofloxacin Palpitations   Moxifloxacin Hcl In Nacl Palpitations   Sulfa Antibiotics Palpitations      Medication List      esomeprazole 40 MG capsule Commonly known as:  NEXIUM Take by mouth at bedtime.   furosemide 20 MG tablet Commonly known as:  LASIX Take by mouth as needed.   mometasone 50 MCG/ACT nasal spray Commonly known as:  NASONEX Place 2 sprays into the nose daily.   PROBIOTIC PO Take by mouth.   propranolol ER 60 MG 24 hr capsule Commonly known as:  INDERAL LA 60 mg daily.     ranitidine 300 MG capsule Commonly known as:  ZANTAC Take 1 capsule (300 mg total) by mouth every evening.       Past Medical History:  Diagnosis Date  . Anxiety   . Colon polyps   . Diverticulosis   . GERD (gastroesophageal reflux disease)   . Hiatal hernia   . Hypertension     Past Surgical History:  Procedure Laterality Date  . episiotomy  1984   During childbirth.     Review of systems negative except as noted in HPI / PMHx or noted below:  Review of Systems  Constitutional: Negative.   HENT: Negative.   Eyes: Negative.   Respiratory: Negative.   Cardiovascular: Negative.   Gastrointestinal: Negative.   Genitourinary: Negative.   Musculoskeletal: Negative.   Skin: Negative.   Neurological: Negative.   Endo/Heme/Allergies: Negative.   Psychiatric/Behavioral: Negative.      Objective:   Vitals:   03/08/17 1533  BP: (!) 140/92  Pulse: 80  Resp: 16          Physical Exam  Constitutional: She is well-developed, well-nourished, and in no distress.  HENT:  Head: Normocephalic.  Right Ear: Tympanic membrane, external ear and ear canal normal.  Left Ear: Tympanic membrane, external ear and ear canal normal.  Nose: Nose normal. No mucosal edema or rhinorrhea.  Mouth/Throat: Uvula is midline, oropharynx is clear and moist and mucous membranes are  normal. No oropharyngeal exudate.  Eyes: Conjunctivae are normal.  Neck: Trachea normal. No tracheal tenderness present. No tracheal deviation present. No thyromegaly present.  Cardiovascular: Normal rate, regular rhythm, S1 normal, S2 normal and normal heart sounds.   No murmur heard. Pulmonary/Chest: Breath sounds normal. No stridor. No respiratory distress. She has no wheezes. She has no rales.  Musculoskeletal: She exhibits no edema.  Lymphadenopathy:       Head (right side): No tonsillar adenopathy present.       Head (left side): No tonsillar adenopathy present.    She has no cervical adenopathy.   Neurological: She is alert. Gait normal.  Skin: No rash noted. She is not diaphoretic. No erythema. Nails show no clubbing.  Psychiatric: Mood and affect normal.    Diagnostics: none  Assessment and Plan:   1. Allergic rhinitis due to other allergic trigger, unspecified chronicity, unspecified seasonality   2. LPRD (laryngopharyngeal reflux disease)     1. Continue Nexium 40 mg in a.m. and ranitidine 300 mg in PM    2.  Continue Nasonex and antihistamine  3. Continue immunotherapy and Epi-Pen  4. Return to clinic in 6 months or earlier if problem  Renee Vaughn is really doing quite well and we will continue to have her use anti-inflammatory agents for her respiratory tract and antireflux therapy for her LPR and continue on immunotherapy. If she continues to do this well I will see her back in this clinic in 6 months or earlier if there is a problem.  Allena Katz, MD Allergy / Immunology [place]

## 2017-03-09 NOTE — Patient Instructions (Signed)
  1. Continue Nexium 40 mg in a.m. and ranitidine 300 mg in PM    2.  Continue Nasonex and antihistamine  3. Continue immunotherapy and Epi-Pen  4. Return to clinic in 6 months or earlier if problem

## 2017-03-19 ENCOUNTER — Ambulatory Visit (INDEPENDENT_AMBULATORY_CARE_PROVIDER_SITE_OTHER): Payer: BLUE CROSS/BLUE SHIELD | Admitting: *Deleted

## 2017-03-19 DIAGNOSIS — J309 Allergic rhinitis, unspecified: Secondary | ICD-10-CM | POA: Diagnosis not present

## 2017-03-26 ENCOUNTER — Ambulatory Visit (INDEPENDENT_AMBULATORY_CARE_PROVIDER_SITE_OTHER): Payer: BLUE CROSS/BLUE SHIELD | Admitting: *Deleted

## 2017-03-26 DIAGNOSIS — J309 Allergic rhinitis, unspecified: Secondary | ICD-10-CM

## 2017-04-12 ENCOUNTER — Ambulatory Visit (INDEPENDENT_AMBULATORY_CARE_PROVIDER_SITE_OTHER): Payer: BLUE CROSS/BLUE SHIELD

## 2017-04-12 DIAGNOSIS — J309 Allergic rhinitis, unspecified: Secondary | ICD-10-CM

## 2017-04-20 ENCOUNTER — Ambulatory Visit (INDEPENDENT_AMBULATORY_CARE_PROVIDER_SITE_OTHER): Payer: BLUE CROSS/BLUE SHIELD | Admitting: *Deleted

## 2017-04-20 DIAGNOSIS — J309 Allergic rhinitis, unspecified: Secondary | ICD-10-CM | POA: Diagnosis not present

## 2017-04-27 ENCOUNTER — Ambulatory Visit (INDEPENDENT_AMBULATORY_CARE_PROVIDER_SITE_OTHER): Payer: BLUE CROSS/BLUE SHIELD

## 2017-04-27 DIAGNOSIS — J309 Allergic rhinitis, unspecified: Secondary | ICD-10-CM | POA: Diagnosis not present

## 2017-05-11 ENCOUNTER — Ambulatory Visit (INDEPENDENT_AMBULATORY_CARE_PROVIDER_SITE_OTHER): Payer: BLUE CROSS/BLUE SHIELD | Admitting: *Deleted

## 2017-05-11 DIAGNOSIS — J309 Allergic rhinitis, unspecified: Secondary | ICD-10-CM | POA: Diagnosis not present

## 2017-05-17 ENCOUNTER — Ambulatory Visit (INDEPENDENT_AMBULATORY_CARE_PROVIDER_SITE_OTHER): Payer: BLUE CROSS/BLUE SHIELD

## 2017-05-17 DIAGNOSIS — J309 Allergic rhinitis, unspecified: Secondary | ICD-10-CM | POA: Diagnosis not present

## 2017-05-28 ENCOUNTER — Ambulatory Visit (INDEPENDENT_AMBULATORY_CARE_PROVIDER_SITE_OTHER): Payer: BLUE CROSS/BLUE SHIELD | Admitting: *Deleted

## 2017-05-28 DIAGNOSIS — J309 Allergic rhinitis, unspecified: Secondary | ICD-10-CM

## 2017-06-08 ENCOUNTER — Ambulatory Visit (INDEPENDENT_AMBULATORY_CARE_PROVIDER_SITE_OTHER): Payer: BLUE CROSS/BLUE SHIELD

## 2017-06-08 DIAGNOSIS — J309 Allergic rhinitis, unspecified: Secondary | ICD-10-CM

## 2017-06-14 ENCOUNTER — Ambulatory Visit (INDEPENDENT_AMBULATORY_CARE_PROVIDER_SITE_OTHER): Payer: BLUE CROSS/BLUE SHIELD | Admitting: *Deleted

## 2017-06-14 DIAGNOSIS — J309 Allergic rhinitis, unspecified: Secondary | ICD-10-CM | POA: Diagnosis not present

## 2017-06-19 DIAGNOSIS — J3089 Other allergic rhinitis: Secondary | ICD-10-CM | POA: Diagnosis not present

## 2017-06-19 NOTE — Progress Notes (Signed)
Vial exp 06-19-18

## 2017-06-21 ENCOUNTER — Ambulatory Visit (INDEPENDENT_AMBULATORY_CARE_PROVIDER_SITE_OTHER): Payer: BLUE CROSS/BLUE SHIELD | Admitting: *Deleted

## 2017-06-21 DIAGNOSIS — J309 Allergic rhinitis, unspecified: Secondary | ICD-10-CM

## 2017-06-29 ENCOUNTER — Ambulatory Visit (INDEPENDENT_AMBULATORY_CARE_PROVIDER_SITE_OTHER): Payer: BLUE CROSS/BLUE SHIELD

## 2017-06-29 DIAGNOSIS — J309 Allergic rhinitis, unspecified: Secondary | ICD-10-CM

## 2017-07-03 ENCOUNTER — Other Ambulatory Visit: Payer: Self-pay | Admitting: Allergy and Immunology

## 2017-07-13 ENCOUNTER — Ambulatory Visit (INDEPENDENT_AMBULATORY_CARE_PROVIDER_SITE_OTHER): Payer: 59 | Admitting: *Deleted

## 2017-07-13 DIAGNOSIS — J309 Allergic rhinitis, unspecified: Secondary | ICD-10-CM | POA: Diagnosis not present

## 2017-07-23 ENCOUNTER — Ambulatory Visit (INDEPENDENT_AMBULATORY_CARE_PROVIDER_SITE_OTHER): Payer: 59 | Admitting: *Deleted

## 2017-07-23 DIAGNOSIS — J309 Allergic rhinitis, unspecified: Secondary | ICD-10-CM | POA: Diagnosis not present

## 2017-08-09 ENCOUNTER — Ambulatory Visit (INDEPENDENT_AMBULATORY_CARE_PROVIDER_SITE_OTHER): Payer: 59 | Admitting: *Deleted

## 2017-08-09 DIAGNOSIS — J309 Allergic rhinitis, unspecified: Secondary | ICD-10-CM | POA: Diagnosis not present

## 2017-08-13 ENCOUNTER — Telehealth: Payer: Self-pay | Admitting: Allergy

## 2017-08-13 NOTE — Telephone Encounter (Signed)
Patient called and would like to talk to you about her bill [phone]

## 2017-08-13 NOTE — Telephone Encounter (Signed)
Pt has new ins with [employer] - her inj is her copay - she will probably pay weekly when she gets her inj - kt

## 2017-08-17 ENCOUNTER — Ambulatory Visit (INDEPENDENT_AMBULATORY_CARE_PROVIDER_SITE_OTHER): Payer: 59 | Admitting: *Deleted

## 2017-08-17 DIAGNOSIS — J309 Allergic rhinitis, unspecified: Secondary | ICD-10-CM

## 2017-08-20 ENCOUNTER — Ambulatory Visit (INDEPENDENT_AMBULATORY_CARE_PROVIDER_SITE_OTHER): Payer: 59 | Admitting: *Deleted

## 2017-08-20 DIAGNOSIS — J309 Allergic rhinitis, unspecified: Secondary | ICD-10-CM | POA: Diagnosis not present

## 2017-09-03 ENCOUNTER — Ambulatory Visit: Payer: 59 | Admitting: Allergy and Immunology

## 2017-09-03 ENCOUNTER — Ambulatory Visit (INDEPENDENT_AMBULATORY_CARE_PROVIDER_SITE_OTHER): Payer: 59 | Admitting: *Deleted

## 2017-09-03 DIAGNOSIS — J309 Allergic rhinitis, unspecified: Secondary | ICD-10-CM

## 2017-09-17 ENCOUNTER — Telehealth: Payer: Self-pay | Admitting: Allergy

## 2017-09-17 NOTE — Telephone Encounter (Signed)
Patient called and wanted to talk to you about her bill. Phone number [phone]

## 2017-09-17 NOTE — Telephone Encounter (Signed)
Pt called about the no show fee - she said she did not have an appointment that day - she was here for an allergy injection - voided fee - kt

## 2017-09-20 ENCOUNTER — Ambulatory Visit (INDEPENDENT_AMBULATORY_CARE_PROVIDER_SITE_OTHER): Payer: 59 | Admitting: *Deleted

## 2017-09-20 DIAGNOSIS — J309 Allergic rhinitis, unspecified: Secondary | ICD-10-CM | POA: Diagnosis not present

## 2017-10-01 ENCOUNTER — Ambulatory Visit (INDEPENDENT_AMBULATORY_CARE_PROVIDER_SITE_OTHER): Payer: 59 | Admitting: *Deleted

## 2017-10-01 DIAGNOSIS — J309 Allergic rhinitis, unspecified: Secondary | ICD-10-CM

## 2017-10-03 DIAGNOSIS — J3089 Other allergic rhinitis: Secondary | ICD-10-CM | POA: Diagnosis not present

## 2017-10-03 NOTE — Progress Notes (Signed)
VIAL EXP 10-03-18

## 2017-10-15 ENCOUNTER — Ambulatory Visit (INDEPENDENT_AMBULATORY_CARE_PROVIDER_SITE_OTHER): Payer: 59

## 2017-10-15 DIAGNOSIS — J309 Allergic rhinitis, unspecified: Secondary | ICD-10-CM | POA: Diagnosis not present

## 2017-10-25 ENCOUNTER — Ambulatory Visit (INDEPENDENT_AMBULATORY_CARE_PROVIDER_SITE_OTHER): Payer: 59 | Admitting: *Deleted

## 2017-10-25 DIAGNOSIS — J309 Allergic rhinitis, unspecified: Secondary | ICD-10-CM | POA: Diagnosis not present

## 2017-11-12 ENCOUNTER — Ambulatory Visit (INDEPENDENT_AMBULATORY_CARE_PROVIDER_SITE_OTHER): Payer: 59 | Admitting: *Deleted

## 2017-11-12 DIAGNOSIS — J309 Allergic rhinitis, unspecified: Secondary | ICD-10-CM

## 2017-11-30 ENCOUNTER — Ambulatory Visit (INDEPENDENT_AMBULATORY_CARE_PROVIDER_SITE_OTHER): Payer: 59

## 2017-11-30 DIAGNOSIS — J309 Allergic rhinitis, unspecified: Secondary | ICD-10-CM

## 2017-12-10 ENCOUNTER — Ambulatory Visit (INDEPENDENT_AMBULATORY_CARE_PROVIDER_SITE_OTHER): Payer: 59

## 2017-12-10 DIAGNOSIS — J309 Allergic rhinitis, unspecified: Secondary | ICD-10-CM

## 2017-12-31 ENCOUNTER — Ambulatory Visit (INDEPENDENT_AMBULATORY_CARE_PROVIDER_SITE_OTHER): Payer: 59 | Admitting: *Deleted

## 2017-12-31 DIAGNOSIS — J309 Allergic rhinitis, unspecified: Secondary | ICD-10-CM | POA: Diagnosis not present

## 2018-01-07 ENCOUNTER — Ambulatory Visit (INDEPENDENT_AMBULATORY_CARE_PROVIDER_SITE_OTHER): Payer: 59 | Admitting: *Deleted

## 2018-01-07 DIAGNOSIS — J309 Allergic rhinitis, unspecified: Secondary | ICD-10-CM | POA: Diagnosis not present

## 2018-01-17 ENCOUNTER — Ambulatory Visit (INDEPENDENT_AMBULATORY_CARE_PROVIDER_SITE_OTHER): Payer: 59 | Admitting: *Deleted

## 2018-01-17 DIAGNOSIS — J309 Allergic rhinitis, unspecified: Secondary | ICD-10-CM

## 2018-01-24 ENCOUNTER — Ambulatory Visit (INDEPENDENT_AMBULATORY_CARE_PROVIDER_SITE_OTHER): Payer: 59 | Admitting: *Deleted

## 2018-01-24 DIAGNOSIS — J309 Allergic rhinitis, unspecified: Secondary | ICD-10-CM

## 2018-02-04 ENCOUNTER — Ambulatory Visit (INDEPENDENT_AMBULATORY_CARE_PROVIDER_SITE_OTHER): Payer: 59 | Admitting: *Deleted

## 2018-02-04 DIAGNOSIS — J309 Allergic rhinitis, unspecified: Secondary | ICD-10-CM

## 2018-02-06 DIAGNOSIS — J3089 Other allergic rhinitis: Secondary | ICD-10-CM | POA: Diagnosis not present

## 2018-02-06 NOTE — Progress Notes (Signed)
Exp. 02/07/19

## 2018-02-21 ENCOUNTER — Ambulatory Visit (INDEPENDENT_AMBULATORY_CARE_PROVIDER_SITE_OTHER): Payer: 59 | Admitting: *Deleted

## 2018-02-21 DIAGNOSIS — J309 Allergic rhinitis, unspecified: Secondary | ICD-10-CM | POA: Diagnosis not present

## 2018-03-11 ENCOUNTER — Other Ambulatory Visit: Payer: Self-pay | Admitting: Gastroenterology

## 2018-03-11 NOTE — Telephone Encounter (Signed)
Please see message from patient.  Is it OK to refill her medication?  Please provide sig directions.  Also, when is she due for colonoscopy?  Please advise.  Thank you.

## 2018-03-11 NOTE — Telephone Encounter (Signed)
Recal colonoscopy 03/2020. Can use Anusol HC suppositories  Pr 1 twice daily after the bowel movement for 7-10 days, 6 refills

## 2018-03-12 MED ORDER — HYDROCORTISONE 2.5 % RE CREA: TOPICAL_CREAM | RECTAL | 6 refills | Status: DC

## 2018-03-12 NOTE — Telephone Encounter (Signed)
Sent medication to patients pharmacy.  

## 2018-03-12 NOTE — Telephone Encounter (Signed)
I left a message for the patient that her medication was sent to the pharmacy and when her next colonoscopy was due.  Recall set in [place].

## 2018-03-18 ENCOUNTER — Ambulatory Visit (INDEPENDENT_AMBULATORY_CARE_PROVIDER_SITE_OTHER): Payer: 59 | Admitting: *Deleted

## 2018-03-18 DIAGNOSIS — J309 Allergic rhinitis, unspecified: Secondary | ICD-10-CM

## 2018-04-01 ENCOUNTER — Ambulatory Visit: Payer: 59 | Admitting: Allergy and Immunology

## 2018-04-01 ENCOUNTER — Encounter: Payer: Self-pay | Admitting: Allergy and Immunology

## 2018-04-01 VITALS — BP 130/90 | HR 74 | Resp 16

## 2018-04-01 DIAGNOSIS — K219 Gastro-esophageal reflux disease without esophagitis: Secondary | ICD-10-CM | POA: Diagnosis not present

## 2018-04-01 DIAGNOSIS — J3089 Other allergic rhinitis: Secondary | ICD-10-CM | POA: Diagnosis not present

## 2018-04-01 MED ORDER — OLOPATADINE HCL 0.1 % OP SOLN: OPHTHALMIC | 11 refills | Status: DC

## 2018-04-01 MED ORDER — EPINEPHRINE 0.3 MG/0.3ML IJ SOAJ: 0.3000 mg | Freq: Once | INTRAMUSCULAR | 2 refills | Status: AC

## 2018-04-01 NOTE — Patient Instructions (Addendum)
  1. Continue Nexium 20 mg in a.m. and ranitidine 300 mg in PM    2.  Continue Flonase and antihistamine  3. Continue immunotherapy and Epi-Pen / Auvi-Q  4. Can try Patanol 1 drop each eye 2 times per day  5. Return to clinic in 12 months or earlier if problem  6. Check area 2 aeroallergen profile

## 2018-04-01 NOTE — Progress Notes (Signed)
Follow-up Note  Referring Provider: Raina Mina., MD Primary Provider: Raina Mina., MD Date of Office Visit: 04/01/2018  Subjective:   Renee Vaughn (DOB: 1966-04-25) is a 52 y.o. female who returns to the [facility] on 04/01/2018 in re-evaluation of the following:  HPI: Chalet returns to this clinic in reevaluation of her allergic rhinitis treated with immunotherapy and LPR.  Her last visit to this clinic was 08 March 2017.  Immunotherapy is going well and is currently at every 2 weeks.  She believes that this has helped her tremendously regarding her upper airway issues.  She still gets some occasional nasal congestion and she is using Flonase especially during the spring.  She has been having some itchy eyes this spring and she has tried Museum/gallery exhibitions officer and she has tried some other type of over-the-counter drop which is not resulting in good control this issue.  She wonders if she has become allergic to some other aeroallergens as she is only receiving immunotherapy for dust mite.  Her reflux is under very good control at this point in time.  She has decreased her dose of proton pump inhibitor and occasionally uses her ranitidine.  Allergies as of 04/01/2018      Reactions   Dextromethorphan Other (See Comments)   Morphine And Related    Pt complained of palpitations, and chest pain. States that she just didn't feel well after taking it.   Codeine Palpitations   Levofloxacin Palpitations   Moxifloxacin Hcl In Nacl Palpitations   Sulfa Antibiotics Palpitations      Medication List      bumetanide 1 MG tablet Commonly known as:  BUMEX Take by mouth.   EPINEPHrine 0.3 mg/0.3 mL Soaj injection Commonly known as:  AUVI-Q Inject 0.3 mLs (0.3 mg total) into the muscle once for 1 dose. As directed for life-threatening allergic reactions   esomeprazole 20 MG capsule Commonly known as:  NEXIUM Take by mouth at bedtime.   fluticasone 50 MCG/ACT nasal  spray Commonly known as:  FLONASE Place 1 spray into both nostrils daily.   PROBIOTIC PO Take by mouth.   propranolol ER 60 MG 24 hr capsule Commonly known as:  INDERAL LA 60 mg daily.   ranitidine 300 MG capsule Commonly known as:  ZANTAC Take 1 capsule (300 mg total) by mouth every evening.       Past Medical History:  Diagnosis Date  . Anxiety   . Colon polyps   . Diverticulosis   . GERD (gastroesophageal reflux disease)   . Hiatal hernia   . Hypertension     Past Surgical History:  Procedure Laterality Date  . episiotomy  1984   During childbirth.     Review of systems negative except as noted in HPI / PMHx or noted below:  Review of Systems  Constitutional: Negative.   HENT: Negative.   Eyes: Negative.   Respiratory: Negative.   Cardiovascular: Negative.   Gastrointestinal: Negative.   Genitourinary: Negative.   Musculoskeletal: Negative.   Skin: Negative.   Neurological: Negative.   Endo/Heme/Allergies: Negative.   Psychiatric/Behavioral: Negative.      Objective:   Vitals:   04/01/18 1551  BP: 130/90  Pulse: 74  Resp: 16          Physical Exam  HENT:  Head: Normocephalic.  Right Ear: Tympanic membrane, external ear and ear canal normal.  Left Ear: Tympanic membrane, external ear and ear canal normal.  Nose: Nose normal.  No mucosal edema or rhinorrhea.  Mouth/Throat: Uvula is midline, oropharynx is clear and moist and mucous membranes are normal. No oropharyngeal exudate.  Eyes: Conjunctivae are normal.  Neck: Trachea normal. No tracheal tenderness present. No tracheal deviation present. No thyromegaly present.  Cardiovascular: Normal rate, regular rhythm, S1 normal, S2 normal and normal heart sounds.  No murmur heard. Pulmonary/Chest: Breath sounds normal. No stridor. No respiratory distress. She has no wheezes. She has no rales.  Musculoskeletal: She exhibits no edema.  Lymphadenopathy:       Head (right side): No tonsillar  adenopathy present.       Head (left side): No tonsillar adenopathy present.    She has no cervical adenopathy.  Neurological: She is alert.  Skin: No rash noted. She is not diaphoretic. No erythema. Nails show no clubbing.    Diagnostics: none  Assessment and Plan:   1. Other allergic rhinitis   2. LPRD (laryngopharyngeal reflux disease)     1. Continue Nexium 20 mg in a.m. and ranitidine 300 mg in PM    2.  Continue Flonase and antihistamine  3. Continue immunotherapy and Epi-Pen / Auvi-Q   4. Can try Patanol 1 drop each eye 2 times per day  5. Return to clinic in 12 months or earlier if problem  6. Check area 2 aeroallergen profile  Lauren appears to be doing quite well on her current plan and I will see her back in this clinic in approximately 12 months.  We will explore whether or not she has developed new aeroallergens sensitivities by checking an area two aero allergen profile.  She will continue to treat inflammation of her upper airway with a topical nasal steroid and continue to treat reflux with therapy as noted above.  Allena Katz, MD Allergy / Immunology [place]

## 2018-04-02 ENCOUNTER — Other Ambulatory Visit: Payer: Self-pay | Admitting: *Deleted

## 2018-04-02 ENCOUNTER — Other Ambulatory Visit: Payer: Self-pay

## 2018-04-02 ENCOUNTER — Encounter: Payer: Self-pay | Admitting: Allergy and Immunology

## 2018-04-02 DIAGNOSIS — J309 Allergic rhinitis, unspecified: Secondary | ICD-10-CM

## 2018-04-02 MED ORDER — OLOPATADINE HCL 0.2 % OP SOLN: OPHTHALMIC | 5 refills | Status: DC

## 2018-04-02 NOTE — Telephone Encounter (Signed)
Medicine change patanol to pataday.

## 2018-04-15 ENCOUNTER — Ambulatory Visit (INDEPENDENT_AMBULATORY_CARE_PROVIDER_SITE_OTHER): Payer: 59 | Admitting: *Deleted

## 2018-04-15 DIAGNOSIS — J309 Allergic rhinitis, unspecified: Secondary | ICD-10-CM

## 2018-04-18 DIAGNOSIS — J309 Allergic rhinitis, unspecified: Secondary | ICD-10-CM | POA: Diagnosis not present

## 2018-04-25 LAB — ALLERGENS W/TOTAL IGE AREA 2
Alternaria Alternata IgE: <0.1 kU/L
Aspergillus Fumigatus IgE: <0.1 kU/L
Bermuda Grass IgE: <0.1 kU/L
Cedar, Mountain IgE: <0.1 kU/L
Cockroach, German IgE: <0.1 kU/L
Cottonwood IgE: <0.1 kU/L
D Pteronyssinus IgE: <0.1 kU/L
IGE (IMMUNOGLOBULIN E), SERUM: 124 [IU]/mL (ref 6–495)
Johnson Grass IgE: <0.1 kU/L
Pigweed, Rough IgE: <0.1 kU/L
Ragweed, Short IgE: <0.1 kU/L
Sheep Sorrel IgE Qn: <0.1 kU/L
Timothy Grass IgE: <0.1 kU/L

## 2018-04-26 ENCOUNTER — Telehealth: Payer: Self-pay | Admitting: Allergy and Immunology

## 2018-04-26 NOTE — Telephone Encounter (Signed)
Jaquayla would like the results to her lab work.

## 2018-04-29 ENCOUNTER — Ambulatory Visit (INDEPENDENT_AMBULATORY_CARE_PROVIDER_SITE_OTHER): Payer: 59 | Admitting: *Deleted

## 2018-04-29 DIAGNOSIS — J309 Allergic rhinitis, unspecified: Secondary | ICD-10-CM | POA: Diagnosis not present

## 2018-04-30 DIAGNOSIS — D649 Anemia, unspecified: Secondary | ICD-10-CM | POA: Diagnosis not present

## 2018-05-17 ENCOUNTER — Ambulatory Visit (INDEPENDENT_AMBULATORY_CARE_PROVIDER_SITE_OTHER): Payer: 59 | Admitting: *Deleted

## 2018-05-17 DIAGNOSIS — J309 Allergic rhinitis, unspecified: Secondary | ICD-10-CM

## 2018-06-06 ENCOUNTER — Ambulatory Visit (INDEPENDENT_AMBULATORY_CARE_PROVIDER_SITE_OTHER): Payer: 59 | Admitting: *Deleted

## 2018-06-06 DIAGNOSIS — J309 Allergic rhinitis, unspecified: Secondary | ICD-10-CM

## 2018-06-10 ENCOUNTER — Ambulatory Visit (INDEPENDENT_AMBULATORY_CARE_PROVIDER_SITE_OTHER): Payer: 59 | Admitting: *Deleted

## 2018-06-10 DIAGNOSIS — J309 Allergic rhinitis, unspecified: Secondary | ICD-10-CM | POA: Diagnosis not present

## 2018-06-19 DIAGNOSIS — Z1231 Encounter for screening mammogram for malignant neoplasm of breast: Secondary | ICD-10-CM | POA: Diagnosis not present

## 2018-06-24 ENCOUNTER — Ambulatory Visit (INDEPENDENT_AMBULATORY_CARE_PROVIDER_SITE_OTHER): Payer: 59 | Admitting: *Deleted

## 2018-06-24 DIAGNOSIS — J309 Allergic rhinitis, unspecified: Secondary | ICD-10-CM | POA: Diagnosis not present

## 2018-07-05 ENCOUNTER — Ambulatory Visit (INDEPENDENT_AMBULATORY_CARE_PROVIDER_SITE_OTHER): Payer: 59 | Admitting: *Deleted

## 2018-07-05 DIAGNOSIS — J309 Allergic rhinitis, unspecified: Secondary | ICD-10-CM

## 2018-07-15 ENCOUNTER — Ambulatory Visit (INDEPENDENT_AMBULATORY_CARE_PROVIDER_SITE_OTHER): Payer: 59 | Admitting: *Deleted

## 2018-07-15 DIAGNOSIS — J309 Allergic rhinitis, unspecified: Secondary | ICD-10-CM

## 2018-07-21 DIAGNOSIS — J019 Acute sinusitis, unspecified: Secondary | ICD-10-CM | POA: Diagnosis not present

## 2018-07-21 DIAGNOSIS — B9689 Other specified bacterial agents as the cause of diseases classified elsewhere: Secondary | ICD-10-CM | POA: Diagnosis not present

## 2018-08-02 ENCOUNTER — Ambulatory Visit (INDEPENDENT_AMBULATORY_CARE_PROVIDER_SITE_OTHER): Payer: 59 | Admitting: *Deleted

## 2018-08-02 DIAGNOSIS — J309 Allergic rhinitis, unspecified: Secondary | ICD-10-CM

## 2018-08-07 DIAGNOSIS — N951 Menopausal and female climacteric states: Secondary | ICD-10-CM | POA: Diagnosis not present

## 2018-08-07 DIAGNOSIS — Z01419 Encounter for gynecological examination (general) (routine) without abnormal findings: Secondary | ICD-10-CM | POA: Diagnosis not present

## 2018-08-09 ENCOUNTER — Ambulatory Visit (INDEPENDENT_AMBULATORY_CARE_PROVIDER_SITE_OTHER): Payer: 59 | Admitting: *Deleted

## 2018-08-09 DIAGNOSIS — J309 Allergic rhinitis, unspecified: Secondary | ICD-10-CM | POA: Diagnosis not present

## 2018-08-19 ENCOUNTER — Ambulatory Visit (INDEPENDENT_AMBULATORY_CARE_PROVIDER_SITE_OTHER): Payer: 59 | Admitting: *Deleted

## 2018-08-19 DIAGNOSIS — J309 Allergic rhinitis, unspecified: Secondary | ICD-10-CM

## 2018-08-27 NOTE — Progress Notes (Signed)
EXP. 08/29/19

## 2018-08-28 DIAGNOSIS — J301 Allergic rhinitis due to pollen: Secondary | ICD-10-CM | POA: Diagnosis not present

## 2018-09-06 ENCOUNTER — Ambulatory Visit (INDEPENDENT_AMBULATORY_CARE_PROVIDER_SITE_OTHER): Payer: 59 | Admitting: *Deleted

## 2018-09-06 DIAGNOSIS — J309 Allergic rhinitis, unspecified: Secondary | ICD-10-CM | POA: Diagnosis not present

## 2018-09-24 DIAGNOSIS — E782 Mixed hyperlipidemia: Secondary | ICD-10-CM | POA: Diagnosis not present

## 2018-09-24 DIAGNOSIS — I10 Essential (primary) hypertension: Secondary | ICD-10-CM | POA: Diagnosis not present

## 2018-09-24 DIAGNOSIS — K582 Mixed irritable bowel syndrome: Secondary | ICD-10-CM | POA: Diagnosis not present

## 2018-09-24 DIAGNOSIS — K828 Other specified diseases of gallbladder: Secondary | ICD-10-CM | POA: Diagnosis not present

## 2018-09-24 DIAGNOSIS — Z862 Personal history of diseases of the blood and blood-forming organs and certain disorders involving the immune mechanism: Secondary | ICD-10-CM | POA: Diagnosis not present

## 2018-09-25 ENCOUNTER — Encounter (HOSPITAL_COMMUNITY): Payer: Self-pay | Admitting: Emergency Medicine

## 2018-09-25 ENCOUNTER — Ambulatory Visit (INDEPENDENT_AMBULATORY_CARE_PROVIDER_SITE_OTHER): Payer: 59

## 2018-09-25 ENCOUNTER — Ambulatory Visit (HOSPITAL_COMMUNITY)
Admission: EM | Admit: 2018-09-25 | Discharge: 2018-09-25 | Disposition: A | Discharge status: Home / Self Care | Payer: 59 | Attending: Family Medicine | Admitting: Family Medicine

## 2018-09-25 DIAGNOSIS — S92422A Displaced fracture of distal phalanx of left great toe, initial encounter for closed fracture: Secondary | ICD-10-CM | POA: Diagnosis not present

## 2018-09-25 DIAGNOSIS — S92425A Nondisplaced fracture of distal phalanx of left great toe, initial encounter for closed fracture: Secondary | ICD-10-CM

## 2018-09-25 DIAGNOSIS — W01198A Fall on same level from slipping, tripping and stumbling with subsequent striking against other object, initial encounter: Secondary | ICD-10-CM

## 2018-09-25 NOTE — Discharge Instructions (Addendum)
We will place a postop boot on the foot Rest, ice, elevate Ibuprofen for pain inflammation Follow up as needed for continued or worsening symptoms

## 2018-09-25 NOTE — ED Triage Notes (Signed)
Pt states she slipped on the floor and hurt her L big toe.

## 2018-09-26 ENCOUNTER — Ambulatory Visit (INDEPENDENT_AMBULATORY_CARE_PROVIDER_SITE_OTHER): Payer: 59 | Admitting: *Deleted

## 2018-09-26 DIAGNOSIS — J309 Allergic rhinitis, unspecified: Secondary | ICD-10-CM | POA: Diagnosis not present

## 2018-09-26 NOTE — ED Provider Notes (Signed)
[facility]    CSN: [id number] Arrival date & time: 09/25/18  1546     History   Chief Complaint Chief Complaint  Patient presents with  . Foot Pain    HPI Renee Vaughn is a 52 y.o. female.   Pt is a 52 year old female that presents for left great toe pain that started today. She reports that she was barefoot on a wet floor and slipped but unsure of what she hit her toe on. She is having left great toe pain and swelling. Symptoms have been constant and remained the same. She put ice on the foot and took ibuprofen with some relief. She denies any numbness, tingling or loss of sensation.   ROS per HPI    Foot Pain     Past Medical History:  Diagnosis Date  . Anxiety   . Colon polyps   . Diverticulosis   . GERD (gastroesophageal reflux disease)   . Hiatal hernia   . Hypertension     There are no active problems to display for this patient.   Past Surgical History:  Procedure Laterality Date  . episiotomy  1984   During childbirth.     OB History   None      Home Medications    Prior to Admission medications   Medication Sig Start Date End Date Taking? Authorizing Provider  bumetanide (BUMEX) 1 MG tablet Take by mouth. 09/20/17   [provider]  esomeprazole (NEXIUM) 40 MG capsule Take by mouth at bedtime.    [provider]  fluticasone (FLONASE) 50 MCG/ACT nasal spray Place 1 spray into both nostrils daily.    [provider]  olopatadine (PATANOL) 0.1 % ophthalmic solution Use one drop in each eye twice daily if needed for itchy, watery eyes. 04/01/18   Kozlow, Donnamarie Poag, MD  Olopatadine HCl (PATADAY) 0.2 % SOLN Instill 1 drop to both eyes once daily if needed for itchy eyes. 04/02/18   Kozlow, Donnamarie Poag, MD  Probiotic Product (PROBIOTIC PO) Take by mouth.    [provider]  propranolol ER (INDERAL LA) 60 MG 24 hr capsule 60 mg daily. 12/22/15   [provider]  ranitidine (ZANTAC) 300 MG capsule Take 1  capsule (300 mg total) by mouth every evening. 12/23/15   Kozlow, Donnamarie Poag, MD    Family History Family History  Problem Relation Age of Onset  . Asthma Mother   . Diabetes Mother   . High blood pressure Mother   . COPD Mother   . Prolactinoma Mother   . Diabetes Father   . High blood pressure Father   . Asthma Sister   . Atrial fibrillation Maternal Grandmother   . Asthma Son   . Asthma Son   . Other Son        Eosinophilic Esophagitis    Social History Social History   Tobacco Use  . Smoking status: Never Smoker  . Smokeless tobacco: Never Used  Substance Use Topics  . Alcohol use: Not on file  . Drug use: Not on file     Allergies   Dextromethorphan; Morphine and related; Codeine; Levofloxacin; Moxifloxacin hcl in nacl; and Sulfa antibiotics   Review of Systems Review of Systems   Physical Exam Triage Vital Signs ED Triage Vitals  Enc Vitals Group     BP 09/25/18 1630 (!) 170/91     Pulse Rate 09/25/18 1630 80     Resp 09/25/18 1630 16  Temp 09/25/18 1630 98.1 F (36.7 C)     Temp src --      SpO2 09/25/18 1630 99 %     Weight --      Height --      Head Circumference --      Peak Flow --      Pain Score 09/25/18 1631 4     Pain Loc --      Pain Edu? --      Excl. in [place]? --    No data found.  Updated Vital Signs BP (!) 170/91   Pulse 80   Temp 98.1 F (36.7 C)   Resp 16   SpO2 99%   Visual Acuity Right Eye Distance:   Left Eye Distance:   Bilateral Distance:    Right Eye Near:   Left Eye Near:    Bilateral Near:     Physical Exam  Constitutional: She appears well-developed and well-nourished.  Very pleasant. Non toxic or ill appearing.   HENT:  Head: Normocephalic.  Eyes: Conjunctivae are normal.  Neck: Normal range of motion.  Pulmonary/Chest: Effort normal.  Musculoskeletal: She exhibits edema and tenderness. She exhibits no deformity.  Swelling and tenderness to the left great toe and second toe. Limited flexion of the  great toe. Good sensation and pedal pulse intact.   Neurological: She is alert.  Skin: Skin is warm and dry.  Psychiatric: She has a normal mood and affect.  Nursing note and vitals reviewed.    UC Treatments / Results  Labs (all labs ordered are listed, but only abnormal results are displayed) Labs Reviewed - No data to display  EKG None  Radiology Dg Foot Complete Left  Result Date: 09/25/2018 CLINICAL DATA:  Recent slip on floor with foot pain, initial encounter EXAM: LEFT FOOT - COMPLETE 3+ VIEW COMPARISON:  None. FINDINGS: There is an oblique fracture through the base of the first distal phalanx which extends to the articular surface of the interphalangeal joint. No other fractures are seen. Mild soft tissue swelling is noted. IMPRESSION: First distal phalangeal fracture involving the articular surface. Electronically Signed   By: Inez Catalina M.D.   On: 09/25/2018 16:50    Procedures Procedures (including critical care time)  Medications Ordered in UC Medications - No data to display  Initial Impression / Assessment and Plan / UC Course  I have reviewed the triage vital signs and the nursing notes.  Pertinent labs & imaging results that were available during my care of the patient were reviewed by me and considered in my medical decision making (see chart for details).     Fracture of the distal phalanx of left great toe Post op boot RICE Ibuprofen for pain and inflammation.  Follow up as needed for continued or worsening symptoms Final Clinical Impressions(s) / UC Diagnoses   Final diagnoses:  Closed nondisplaced fracture of distal phalanx of left great toe, initial encounter     Discharge Instructions     We will place a postop boot on the foot Rest, ice, elevate Ibuprofen for pain inflammation Follow up as needed for continued or worsening symptoms     ED Prescriptions    None     Controlled Substance Prescriptions [place] Controlled Substance  Registry consulted? Not Applicable   Orvan July, NP 09/26/18 [phone]

## 2018-10-02 DIAGNOSIS — S92425A Nondisplaced fracture of distal phalanx of left great toe, initial encounter for closed fracture: Secondary | ICD-10-CM | POA: Diagnosis not present

## 2018-10-10 ENCOUNTER — Ambulatory Visit (INDEPENDENT_AMBULATORY_CARE_PROVIDER_SITE_OTHER): Payer: 59 | Admitting: *Deleted

## 2018-10-10 DIAGNOSIS — J309 Allergic rhinitis, unspecified: Secondary | ICD-10-CM | POA: Diagnosis not present

## 2018-10-28 ENCOUNTER — Ambulatory Visit (INDEPENDENT_AMBULATORY_CARE_PROVIDER_SITE_OTHER): Payer: 59 | Admitting: *Deleted

## 2018-10-28 DIAGNOSIS — J309 Allergic rhinitis, unspecified: Secondary | ICD-10-CM

## 2018-11-11 ENCOUNTER — Ambulatory Visit (INDEPENDENT_AMBULATORY_CARE_PROVIDER_SITE_OTHER): Payer: 59 | Admitting: *Deleted

## 2018-11-11 DIAGNOSIS — S92425D Nondisplaced fracture of distal phalanx of left great toe, subsequent encounter for fracture with routine healing: Secondary | ICD-10-CM | POA: Diagnosis not present

## 2018-11-11 DIAGNOSIS — J309 Allergic rhinitis, unspecified: Secondary | ICD-10-CM | POA: Diagnosis not present

## 2018-11-29 ENCOUNTER — Ambulatory Visit (INDEPENDENT_AMBULATORY_CARE_PROVIDER_SITE_OTHER): Payer: 59

## 2018-11-29 DIAGNOSIS — J309 Allergic rhinitis, unspecified: Secondary | ICD-10-CM | POA: Diagnosis not present

## 2018-12-05 DIAGNOSIS — J0100 Acute maxillary sinusitis, unspecified: Secondary | ICD-10-CM | POA: Diagnosis not present

## 2018-12-16 ENCOUNTER — Ambulatory Visit (INDEPENDENT_AMBULATORY_CARE_PROVIDER_SITE_OTHER): Payer: 59

## 2018-12-16 DIAGNOSIS — J309 Allergic rhinitis, unspecified: Secondary | ICD-10-CM

## 2018-12-30 ENCOUNTER — Ambulatory Visit (INDEPENDENT_AMBULATORY_CARE_PROVIDER_SITE_OTHER): Payer: 59 | Admitting: *Deleted

## 2018-12-30 DIAGNOSIS — J309 Allergic rhinitis, unspecified: Secondary | ICD-10-CM | POA: Diagnosis not present

## 2019-01-24 ENCOUNTER — Ambulatory Visit (INDEPENDENT_AMBULATORY_CARE_PROVIDER_SITE_OTHER): Payer: 59 | Admitting: *Deleted

## 2019-01-24 DIAGNOSIS — J309 Allergic rhinitis, unspecified: Secondary | ICD-10-CM

## 2019-02-03 ENCOUNTER — Ambulatory Visit (INDEPENDENT_AMBULATORY_CARE_PROVIDER_SITE_OTHER): Payer: 59 | Admitting: *Deleted

## 2019-02-03 DIAGNOSIS — J309 Allergic rhinitis, unspecified: Secondary | ICD-10-CM | POA: Diagnosis not present

## 2019-02-13 ENCOUNTER — Ambulatory Visit (INDEPENDENT_AMBULATORY_CARE_PROVIDER_SITE_OTHER): Payer: 59 | Admitting: *Deleted

## 2019-02-13 DIAGNOSIS — J309 Allergic rhinitis, unspecified: Secondary | ICD-10-CM

## 2019-02-28 ENCOUNTER — Ambulatory Visit (INDEPENDENT_AMBULATORY_CARE_PROVIDER_SITE_OTHER): Payer: 59 | Admitting: *Deleted

## 2019-02-28 DIAGNOSIS — J309 Allergic rhinitis, unspecified: Secondary | ICD-10-CM | POA: Diagnosis not present

## 2019-03-08 IMAGING — DX DG FOOT COMPLETE 3+V*L*
3 series · 3 of 3 positions shown · non-contrast
Comparison: None.

CLINICAL DATA: Recent slip on floor with foot pain, initial
encounter

EXAM:
LEFT FOOT - COMPLETE 3+ VIEW

[foot ap]
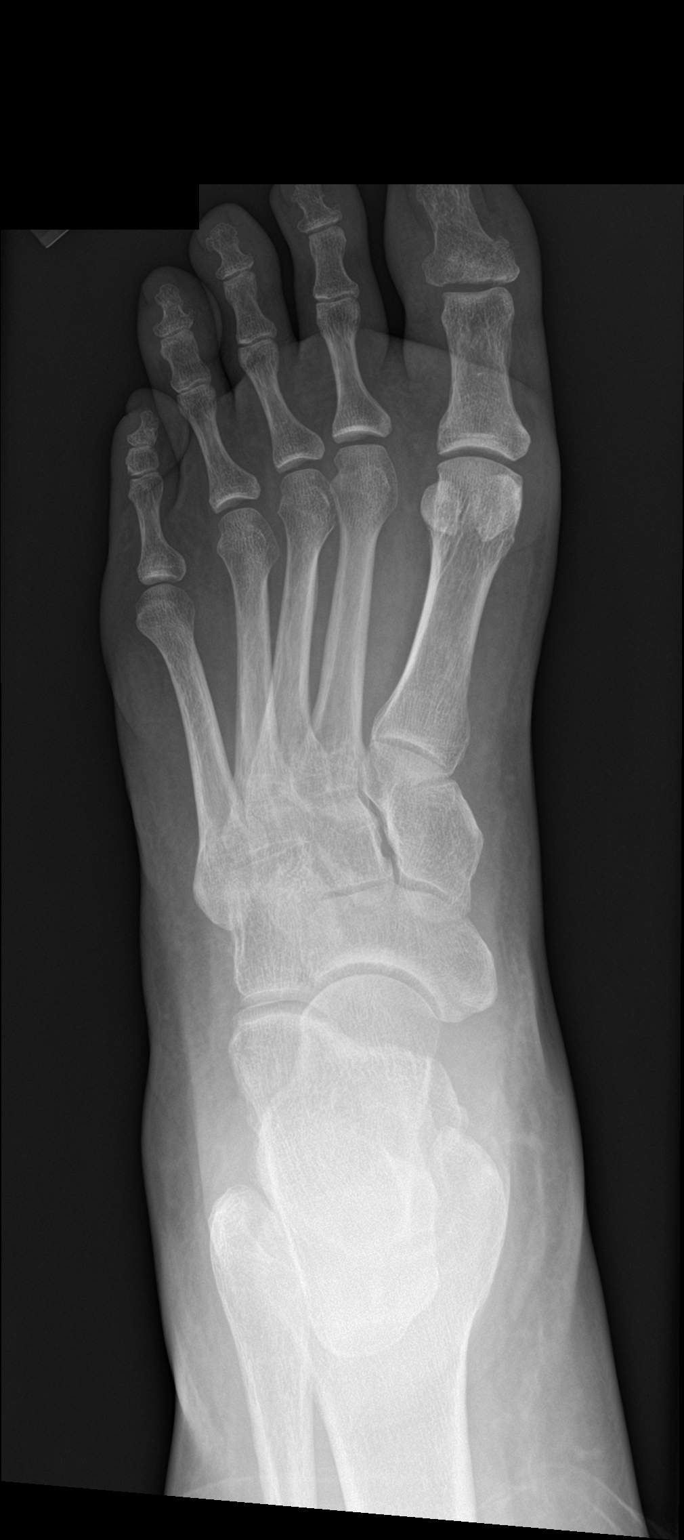

[foot obl]
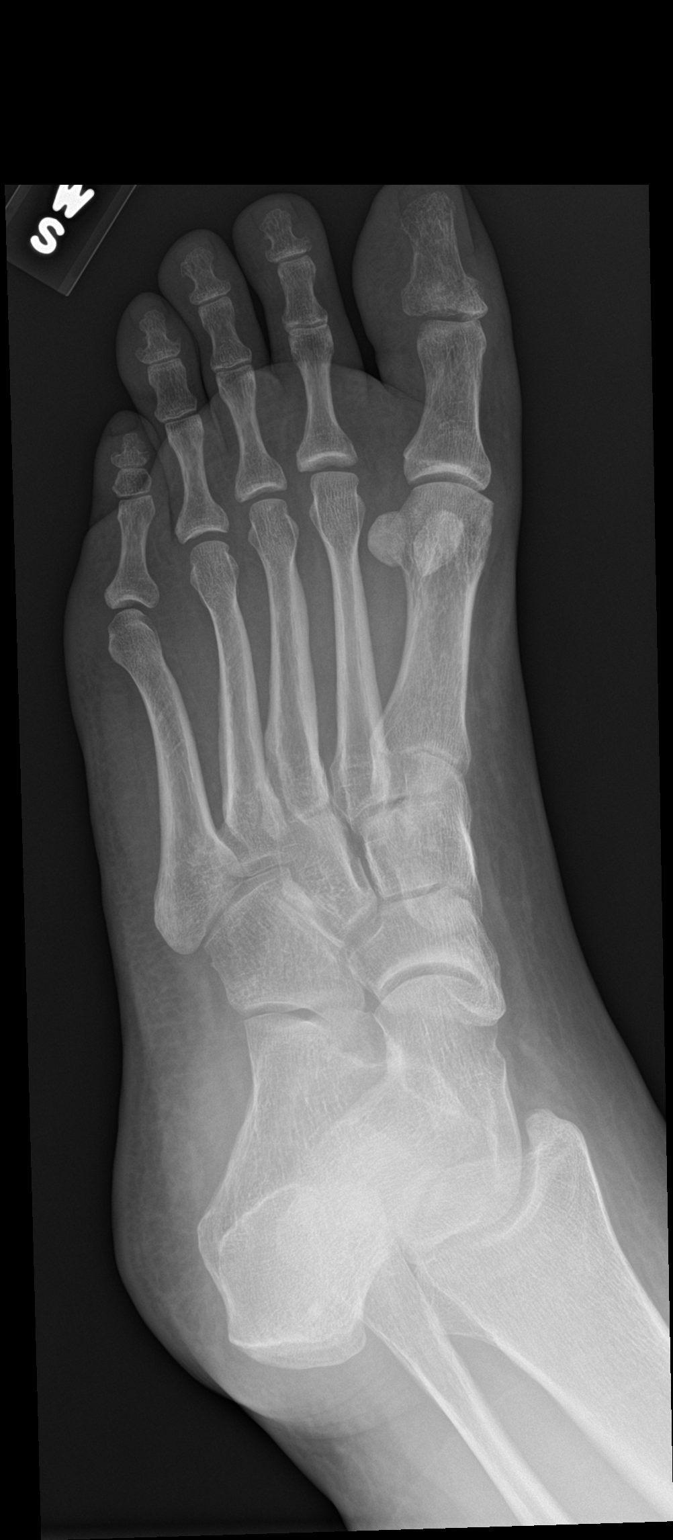

[foot lat]
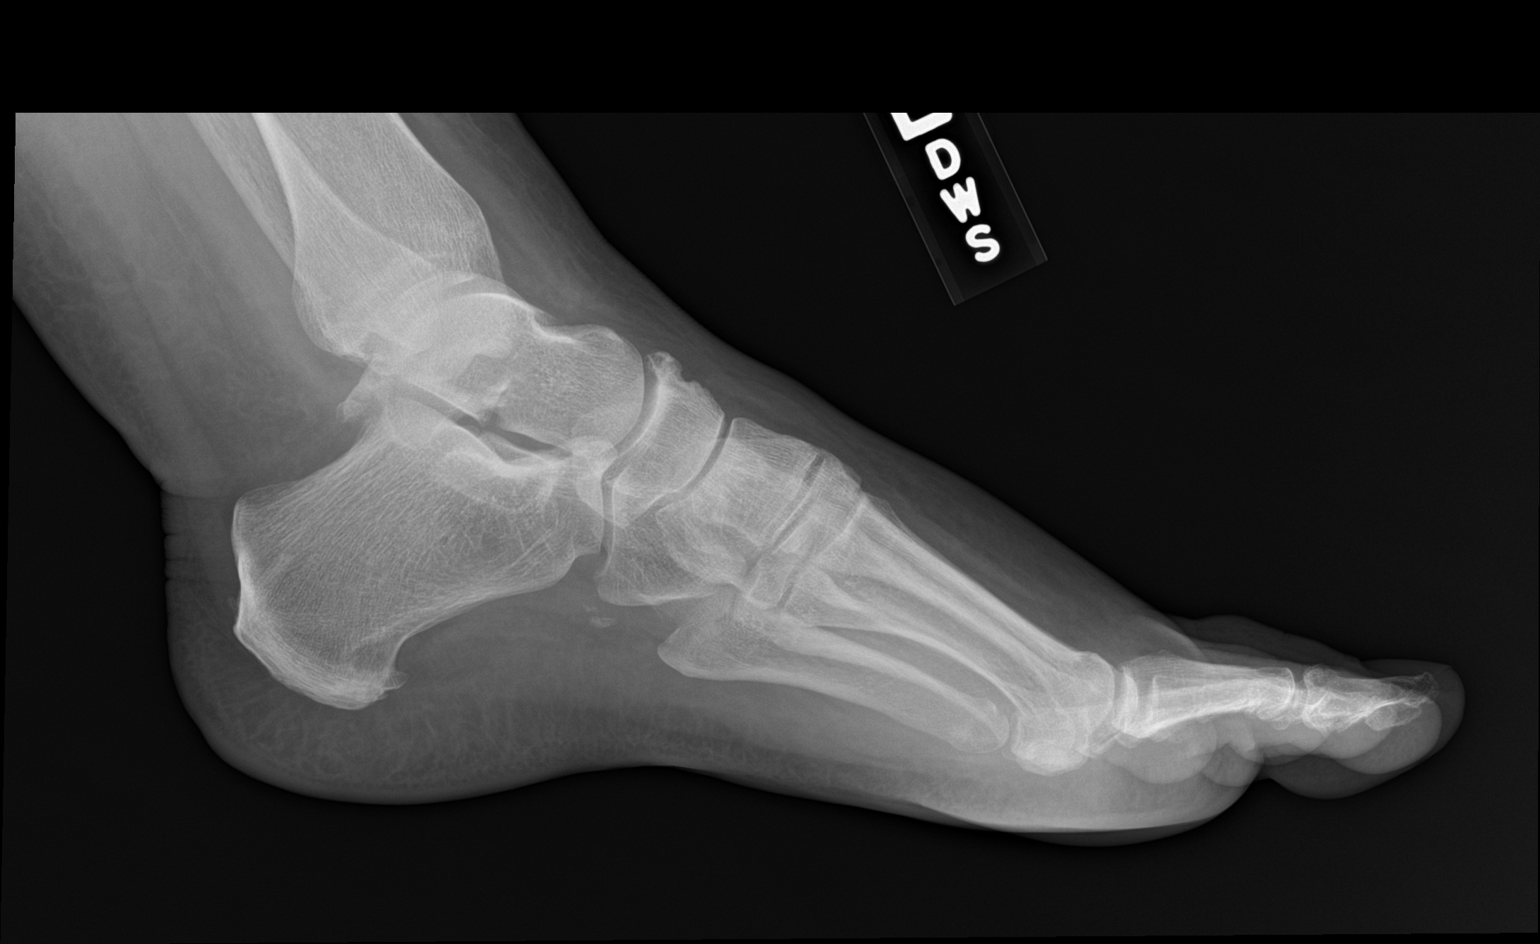

[3 of 3 positions shown; findings below may reference images not displayed]

FINDINGS: There is an oblique fracture through the base of the first distal
phalanx which extends to the articular surface of the
interphalangeal joint. No other fractures are seen. Mild soft tissue
swelling is noted.
IMPRESSION: First distal phalangeal fracture involving the articular surface.

## 2019-03-10 ENCOUNTER — Ambulatory Visit (INDEPENDENT_AMBULATORY_CARE_PROVIDER_SITE_OTHER): Payer: 59 | Admitting: *Deleted

## 2019-03-10 DIAGNOSIS — J309 Allergic rhinitis, unspecified: Secondary | ICD-10-CM

## 2019-03-28 ENCOUNTER — Ambulatory Visit (INDEPENDENT_AMBULATORY_CARE_PROVIDER_SITE_OTHER): Payer: 59 | Admitting: *Deleted

## 2019-03-28 DIAGNOSIS — J309 Allergic rhinitis, unspecified: Secondary | ICD-10-CM

## 2019-04-11 ENCOUNTER — Ambulatory Visit (INDEPENDENT_AMBULATORY_CARE_PROVIDER_SITE_OTHER): Payer: 59 | Admitting: *Deleted

## 2019-04-11 DIAGNOSIS — J309 Allergic rhinitis, unspecified: Secondary | ICD-10-CM | POA: Diagnosis not present

## 2019-04-18 ENCOUNTER — Ambulatory Visit (INDEPENDENT_AMBULATORY_CARE_PROVIDER_SITE_OTHER): Payer: 59 | Admitting: *Deleted

## 2019-04-18 DIAGNOSIS — J309 Allergic rhinitis, unspecified: Secondary | ICD-10-CM

## 2019-04-25 ENCOUNTER — Ambulatory Visit (INDEPENDENT_AMBULATORY_CARE_PROVIDER_SITE_OTHER): Payer: 59 | Admitting: *Deleted

## 2019-04-25 DIAGNOSIS — J309 Allergic rhinitis, unspecified: Secondary | ICD-10-CM

## 2019-04-29 NOTE — Progress Notes (Signed)
VIALS EXP 04-28-2020

## 2019-04-30 DIAGNOSIS — J3089 Other allergic rhinitis: Secondary | ICD-10-CM

## 2019-05-12 ENCOUNTER — Encounter: Payer: Self-pay | Admitting: Gastroenterology

## 2019-05-12 ENCOUNTER — Ambulatory Visit (INDEPENDENT_AMBULATORY_CARE_PROVIDER_SITE_OTHER): Payer: 59

## 2019-05-12 DIAGNOSIS — J309 Allergic rhinitis, unspecified: Secondary | ICD-10-CM | POA: Diagnosis not present

## 2019-05-13 ENCOUNTER — Other Ambulatory Visit: Payer: Self-pay

## 2019-05-13 ENCOUNTER — Telehealth (INDEPENDENT_AMBULATORY_CARE_PROVIDER_SITE_OTHER): Payer: 59 | Admitting: Gastroenterology

## 2019-05-13 ENCOUNTER — Encounter: Payer: Self-pay | Admitting: Gastroenterology

## 2019-05-13 VITALS — Ht 67.0 in | Wt 195.0 lb

## 2019-05-13 DIAGNOSIS — Z86010 Personal history of colonic polyps: Secondary | ICD-10-CM

## 2019-05-13 DIAGNOSIS — Z8371 Family history of colonic polyps: Secondary | ICD-10-CM

## 2019-05-13 DIAGNOSIS — R1030 Lower abdominal pain, unspecified: Secondary | ICD-10-CM

## 2019-05-13 DIAGNOSIS — K581 Irritable bowel syndrome with constipation: Secondary | ICD-10-CM

## 2019-05-13 DIAGNOSIS — R194 Change in bowel habit: Secondary | ICD-10-CM

## 2019-05-13 NOTE — Progress Notes (Signed)
Chief Complaint: Change in bowel habits  Referring Provider:  Raina Mina., MD      ASSESSMENT AND PLAN;   #1. IBS with constipation. Nl TSH 02/2009 #2. Lower abdo pain #3. Change in bowel habits. #4. H/O polyps 04/2015. #5. FH colonic polyps (mom and GM >60)  Plan: - Increase water intake. - High-fiber diet. - Proceed with colonoscopy with miralax in July 2020. Discussed risks & benefits. (Risks including rare perforation req laparotomy, bleeding after biopsies/polypectomy req blood transfusion, rare chance of missing neoplasms, risks of anesthesia/sedation). Benefits outweigh the risks. Patient agrees to proceed. All the questions were answered. - Minimize pain medications including OTC NSAIDs. - Start an exercising-like walking for at least 15-20 minutes 3 times a week. - Continue Miralax 17g po qd. - If still with problems, trial of amitiza, linzess or Trulance. - FU in 12 weeks.     HPI:    Renee Vaughn is a 53 y.o. female   For follow-up visit  Change in bowel habits in form of increasing constipation with passage of pellet-like stools, abdominal bloating.  This is despite MiraLAX.    No nausea, vomiting, heartburn (on nexium), regurgitation, odynophagia or dysphagia.  No significant diarrhea. There is no melena or hematochezia. No unintentional weight loss.  Has lower abdominal crampy pain which gets relieved on defecation.   Past GI procedures: -Colonoscopy 04/09/2015 ([facility]) colonic polyp status post polypectomy, mild sigmoid diverticulosis.  Otherwise normal to TI. Bx- TA. -H/O biliary pancreatitis. ERCP 01/11/2016 [facility]-biliary sphincterotomy s/p balloon sweep with extraction of biliary sludge.  Thereafter, she refused laparoscopic cholecystectomy as she was scared of anesthesia. -Upper GI series 03/28/2013: Small hiatal hernia Past Medical History:  Diagnosis Date  . Anxiety   . Colon polyps   . Diverticulosis   . Endometriosis   . FH: colon polyps    . Fibromyalgia   . GERD (gastroesophageal reflux disease)   . Hiatal hernia   . History of colon polyps   . History of Helicobacter pylori infection   . Hypertension   . IBS (irritable bowel syndrome)   . 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 Internal hemorrhoids   . MVP (mitral valve prolapse)   . Type 2 diabetes mellitus ([place])     Past Surgical History:  Procedure Laterality Date  . COLONOSCOPY  04/09/2015   Colonic polyp, status post polypectomy. Mild sigmoid diverticulosis. Small internal hemorrhoids. OTherwise normal colonoscopy.   Marland Kitchen episiotomy  1984   During childbirth.     Family History  Problem Relation Age of Onset  . Asthma Mother   . Diabetes Mother   . High blood pressure Mother   . COPD Mother   . Prolactinoma Mother   . Diabetes Father   . High blood pressure Father   . Asthma Sister   . Atrial fibrillation Maternal Grandmother   . Asthma Son   . Asthma Son   . Other Son        Eosinophilic Esophagitis    Social History   Tobacco Use  . Smoking status: Never Smoker  . Smokeless tobacco: Never Used  Substance Use Topics  . Alcohol use: Never    Frequency: Never  . Drug use: Not on file    Current Outpatient Medications  Medication Sig Dispense Refill  . bumetanide (BUMEX) 1 MG tablet Take by mouth.    . esomeprazole (NEXIUM) 40 MG capsule Take by mouth at bedtime.    . famotidine (  PEPCID) 20 MG tablet Take 20 mg by mouth as needed for heartburn or indigestion.    . fluticasone (FLONASE) 50 MCG/ACT nasal spray Place 1 spray into both nostrils daily.    . Probiotic Product (PROBIOTIC PO) Take by mouth.    . propranolol ER (INDERAL LA) 60 MG 24 hr capsule 60 mg daily.     No current facility-administered medications for this visit.     Allergies  Allergen Reactions  . Dextromethorphan Other (See Comments)  . Morphine And Related     Pt complained of palpitations, and chest pain. States that she just didn't feel well after taking it.  .  Codeine Palpitations  . Levofloxacin Palpitations  . Moxifloxacin Hcl In Nacl Palpitations  . Sulfa Antibiotics Palpitations    Review of Systems:  neg     Physical Exam:    Ht 5\' 7"  (1.702 m)   Wt 195 lb (88.5 kg)   BMI 30.54 kg/m  Filed Weights   05/13/19 1323  Weight: 195 lb (88.5 kg)   Constitutional:  Well-developed, in no acute distress. Psychiatric: Normal mood and affect. Behavior is normal. HEENT: Pupils normal.  Conjunctivae are normal. No scleral icterus.   Data Reviewed: I have personally reviewed following labs and imaging studies  This service was provided via telemedicine.  The patient was located at office.  The provider was located in home.  The patient did consent to this telephone visit and is aware of possible charges through their insurance for this visit.   Time spent on call/coordination of care: 25 min    Carmell Austria, MD 05/13/2019, 3:00 PM  Cc: Raina Mina., MD

## 2019-05-13 NOTE — Patient Instructions (Addendum)
If you are age 54 or older, your body mass index should be between 23-30. Your Body mass index is 30.54 kg/m. If this is out of the aforementioned range listed, please consider follow up with your Primary Care Provider.  If you are age 39 or younger, your body mass index should be between 19-25. Your Body mass index is 30.54 kg/m. If this is out of the aformentioned range listed, please consider follow up with your Primary Care Provider.   You have been scheduled for a colonoscopy. Please follow written instructions given to you at your visit today.  Please pick up your prep supplies at the pharmacy within the next 1-3 days. If you use inhalers (even only as needed), please bring them with you on the day of your procedure. Your physician has requested that you go to www.startemmi.com and enter the access code given to you at your visit today. This web site gives a general overview about your procedure. However, you should still follow specific instructions given to you by our office regarding your preparation for the procedure.  To help prevent the possible spread of infection to our patients, communities, and staff; we will be implementing the following measures:  As of now we are not allowing any visitors/family members to accompany you to any upcoming appointments with [facility]. If you have any concerns about this please contact our office to discuss prior to the appointment.   Increase water intake.    High-Fiber Diet Fiber, also called dietary fiber, is a type of carbohydrate that is found in fruits, vegetables, whole grains, and beans. A high-fiber diet can have many health benefits. Your health care provider may recommend a high-fiber diet to help:  Prevent constipation. Fiber can make your bowel movements more regular.  Lower your cholesterol.  Relieve the following conditions: ? Swelling of veins in the anus (hemorrhoids). ? Swelling and irritation (inflammation) of  specific areas of the digestive tract (uncomplicated diverticulosis). ? A problem of the large intestine (colon) that sometimes causes pain and diarrhea (irritable bowel syndrome, IBS).  Prevent overeating as part of a weight-loss plan.  Prevent heart disease, type 2 diabetes, and certain cancers. What is my plan? The recommended daily fiber intake in grams (g) includes:  38 g for men age 25 or younger.  30 g for men over age 51.  50 g for women age 33 or younger.  21 g for women over age 38. You can get the recommended daily intake of dietary fiber by:  Eating a variety of fruits, vegetables, grains, and beans.  Taking a fiber supplement, if it is not possible to get enough fiber through your diet. What do I need to know about a high-fiber diet?  It is better to get fiber through food sources rather than from fiber supplements. There is not a lot of research about how effective supplements are.  Always check the fiber content on the nutrition facts label of any prepackaged food. Look for foods that contain 5 g of fiber or more per serving.  Talk with a diet and nutrition specialist (dietitian) if you have questions about specific foods that are recommended or not recommended for your medical condition, especially if those foods are not listed below.  Gradually increase how much fiber you consume. If you increase your intake of dietary fiber too quickly, you may have bloating, cramping, or gas.  Drink plenty of water. Water helps you to digest fiber. What are tips for following this  plan?  Eat a wide variety of high-fiber foods.  Make sure that half of the grains that you eat each day are whole grains.  Eat breads and cereals that are made with whole-grain flour instead of refined flour or white flour.  Eat brown rice, bulgur wheat, or millet instead of white rice.  Start the day with a breakfast that is high in fiber, such as a cereal that contains 5 g of fiber or more per  serving.  Use beans in place of meat in soups, salads, and pasta dishes.  Eat high-fiber snacks, such as berries, raw vegetables, nuts, and popcorn.  Choose whole fruits and vegetables instead of processed forms like juice or sauce. What foods can I eat?  Fruits Berries. Pears. Apples. Oranges. Avocado. Prunes and raisins. Dried figs. Vegetables Sweet potatoes. Spinach. Kale. Artichokes. Cabbage. Broccoli. Cauliflower. Green peas. Carrots. Squash. Grains Whole-grain breads. Multigrain cereal. Oats and oatmeal. Brown rice. Barley. Bulgur wheat. [place]. Quinoa. Bran muffins. Popcorn. Rye wafer crackers. Meats and other proteins Navy, kidney, and pinto beans. Soybeans. Split peas. Lentils. Nuts and seeds. Dairy Fiber-fortified yogurt. Beverages Fiber-fortified soy milk. Fiber-fortified orange juice. Other foods Fiber bars. The items listed above may not be a complete list of recommended foods and beverages. Contact a dietitian for more options. What foods are not recommended? Fruits Fruit juice. Cooked, strained fruit. Vegetables Fried potatoes. Canned vegetables. Well-cooked vegetables. Grains White bread. Pasta made with refined flour. White rice. Meats and other proteins Fatty cuts of meat. Fried chicken or fried fish. Dairy Milk. Yogurt. Cream cheese. Sour cream. Fats and oils Butters. Beverages Soft drinks. Other foods Cakes and pastries. The items listed above may not be a complete list of foods and beverages to avoid. Contact a dietitian for more information. Summary  Fiber is a type of carbohydrate. It is found in fruits, vegetables, whole grains, and beans.  There are many health benefits of eating a high-fiber diet, such as preventing constipation, lowering blood cholesterol, helping with weight loss, and reducing your risk of heart disease, diabetes, and certain cancers.  Gradually increase your intake of fiber. Increasing too fast can result in cramping,  bloating, and gas. Drink plenty of water while you increase your fiber.  The best sources of fiber include whole fruits and vegetables, whole grains, nuts, seeds, and beans. This information is not intended to replace advice given to you by your health care provider. Make sure you discuss any questions you have with your health care provider. Document Released: 11/13/2005 Document Revised: 09/17/2017 Document Reviewed: 09/17/2017 Elsevier Interactive Patient Education  2019 [place] pain medications.   Start exercising.   Continue Miralax.   Follow up 12 weeks  Thank you,  Dr. Jackquline Denmark

## 2019-05-19 ENCOUNTER — Telehealth: Payer: Self-pay | Admitting: Gastroenterology

## 2019-05-19 NOTE — Telephone Encounter (Signed)
patient called in wanting to speak with the nurse she had some questions about her procedure  and would like to discuss.

## 2019-05-19 NOTE — Telephone Encounter (Signed)
Answered patients questions

## 2019-06-02 ENCOUNTER — Ambulatory Visit (INDEPENDENT_AMBULATORY_CARE_PROVIDER_SITE_OTHER): Payer: 59 | Admitting: *Deleted

## 2019-06-02 DIAGNOSIS — J309 Allergic rhinitis, unspecified: Secondary | ICD-10-CM

## 2019-06-18 ENCOUNTER — Telehealth: Payer: Self-pay | Admitting: Gastroenterology

## 2019-06-18 NOTE — Telephone Encounter (Signed)

## 2019-06-18 NOTE — Telephone Encounter (Signed)
Pt. Stated "they called me to do the Covid screening questions and asked me if I have any questions and I told her "I don't do well with sedation,i don't have any questions,I have done multiple colonoscopy and I do not take sedation Dr. Lyndel Safe knows  about my situations and has done them without sedation before."

## 2019-06-19 ENCOUNTER — Ambulatory Visit (AMBULATORY_SURGERY_CENTER): Payer: 59 | Admitting: Gastroenterology

## 2019-06-19 ENCOUNTER — Other Ambulatory Visit: Payer: Self-pay

## 2019-06-19 ENCOUNTER — Encounter: Payer: Self-pay | Admitting: Gastroenterology

## 2019-06-19 VITALS — BP 166/91 | HR 60 | Temp 97.7°F | Resp 13 | Ht 67.0 in | Wt 195.0 lb

## 2019-06-19 DIAGNOSIS — Z86010 Personal history of colonic polyps: Secondary | ICD-10-CM

## 2019-06-19 DIAGNOSIS — D122 Benign neoplasm of ascending colon: Secondary | ICD-10-CM

## 2019-06-19 DIAGNOSIS — K635 Polyp of colon: Secondary | ICD-10-CM

## 2019-06-19 DIAGNOSIS — K588 Other irritable bowel syndrome: Secondary | ICD-10-CM

## 2019-06-19 DIAGNOSIS — D127 Benign neoplasm of rectosigmoid junction: Secondary | ICD-10-CM

## 2019-06-19 DIAGNOSIS — K581 Irritable bowel syndrome with constipation: Secondary | ICD-10-CM

## 2019-06-19 MED ORDER — HYDROCORTISONE ACETATE 25 MG RE SUPP: 25.0000 mg | Freq: Two times a day (BID) | RECTAL | 6 refills | Status: DC

## 2019-06-19 MED ORDER — SODIUM CHLORIDE 0.9 % IV SOLN: 500.0000 mL | Freq: Once | INTRAVENOUS | Status: DC

## 2019-06-19 NOTE — Op Note (Signed)
[place] Patient Name: Renee Vaughn Procedure Date: 06/19/2019 9:44 AM MRN: [id number] Endoscopist: Jackquline Denmark , MD Age: 53 Referring MD:  Date of Birth: 1965-12-18 Gender: Female Account #: [id number] Procedure:                Colonoscopy Indications:              High risk colon cancer surveillance: Personal                            history of colonic polyps Medicines:                None. Patient did not get any sedation. Procedure:                Pre-Anesthesia Assessment:                           - Prior to the procedure, a History and Physical                            was performed, and patient medications and                            allergies were reviewed. The patient's tolerance of                            previous anesthesia was also reviewed. The risks                            and benefits of the procedure and the sedation                            options and risks were discussed with the patient.                            All questions were answered, and informed consent                            was obtained. Prior Anticoagulants: The patient has                            taken no previous anticoagulant or antiplatelet                            agents. ASA Grade Assessment: I - A normal, healthy                            patient. After reviewing the risks and benefits,                            the patient was deemed in satisfactory condition to                            undergo the procedure.  After obtaining informed consent, the colonoscope                            was passed under direct vision. Throughout the                            procedure, the patient's blood pressure, pulse, and                            oxygen saturations were monitored continuously. The                            Model PCF-H190DL ([id number]) scope was introduced                            through the anus and advanced to the 2  cm into the                            ileum. The colonoscopy was performed without                            difficulty. The patient tolerated the procedure                            well. The quality of the bowel preparation was good. Scope In: 10:01:31 AM Scope Out: 10:19:26 AM Scope Withdrawal Time: 0 hours 11 minutes 25 seconds  Total Procedure Duration: 0 hours 17 minutes 55 seconds  Findings:                 Three sessile polyps were found in the                            recto-sigmoid colon and proximal ascending colon.                            The polyps were 2 to 4 mm in size. These polyps                            were removed with a cold biopsy forceps. Resection                            and retrieval were complete.                           A few small-mouthed diverticula were found in the                            sigmoid colon.                           Non-bleeding internal hemorrhoids were found during                            retroflexion. The hemorrhoids were small.  The terminal ileum appeared normal. Complications:            No immediate complications. Estimated Blood Loss:     Estimated blood loss: none. Impression:               -Diminutive colonic polyp status post polypectomy.                           -Mild sigmoid diverticulosis.                           -Non-bleeding internal hemorrhoids.                           -Otherwise normal colonoscopy to TI. Recommendation:           - Patient has a contact number available for                            emergencies. The signs and symptoms of potential                            delayed complications were discussed with the                            patient. Return to normal activities tomorrow.                            Written discharge instructions were provided to the                            patient.                           - Resume previous diet.                            - Continue present medications.                           - Await pathology results.                           - Repeat colonoscopy for surveillance based on                            pathology results.                           - Return to GI clinic PRN.                           - Use hydrocortisone suppository HC 25 mg 2 per                            rectum once a day for 10 days, 6 refills. Jackquline Denmark, MD 06/19/2019 26:71:24 AM This report has been signed electronically.

## 2019-06-19 NOTE — Progress Notes (Signed)
Called to room to assist during endoscopic procedure.  Patient ID and intended procedure confirmed with present staff. Received instructions for my participation in the procedure from the performing physician.  

## 2019-06-19 NOTE — Patient Instructions (Signed)
Discharge instructions given. Handouts on polyps,diverticulosis and hemorrhoids. Resume previous medications. YOU HAD AN ENDOSCOPIC PROCEDURE TODAY AT THE [facility]:   Refer to the procedure report that was given to you for any specific questions about what was found during the examination.  If the procedure report does not answer your questions, please call your gastroenterologist to clarify.  If you requested that your care partner not be given the details of your procedure findings, then the procedure report has been included in a sealed envelope for you to review at your convenience later.  YOU SHOULD EXPECT: Some feelings of bloating in the abdomen. Passage of more gas than usual.  Walking can help get rid of the air that was put into your GI tract during the procedure and reduce the bloating. If you had a lower endoscopy (such as a colonoscopy or flexible sigmoidoscopy) you may notice spotting of blood in your stool or on the toilet paper. If you underwent a bowel prep for your procedure, you may not have a normal bowel movement for a few days.  Please Note:  You might notice some irritation and congestion in your nose or some drainage.  This is from the oxygen used during your procedure.  There is no need for concern and it should clear up in a day or so.  SYMPTOMS TO REPORT IMMEDIATELY:   Following lower endoscopy (colonoscopy or flexible sigmoidoscopy):  Excessive amounts of blood in the stool  Significant tenderness or worsening of abdominal pains  Swelling of the abdomen that is new, acute  Fever of 100F or higher  For urgent or emergent issues, a gastroenterologist can be reached at any hour by calling [phone].   DIET:  We do recommend a small meal at first, but then you may proceed to your regular diet.  Drink plenty of fluids but you should avoid alcoholic beverages for 24 hours.  ACTIVITY:  You should plan to take it easy for the rest of today and you  should NOT DRIVE or use heavy machinery until tomorrow (because of the sedation medicines used during the test).    FOLLOW UP: Our staff will call the number listed on your records 48-72 hours following your procedure to check on you and address any questions or concerns that you may have regarding the information given to you following your procedure. If we do not reach you, we will leave a message.  We will attempt to reach you two times.  During this call, we will ask if you have developed any symptoms of COVID 19. If you develop any symptoms (ie: fever, flu-like symptoms, shortness of breath, cough etc.) before then, please call [phone].  If you test positive for Covid 19 in the 2 weeks post procedure, please call and report this information to us.    If any biopsies were taken you will be contacted by phone or by letter within the next 1-3 weeks.  Please call us at [phone] if you have not heard about the biopsies in 3 weeks.    SIGNATURES/CONFIDENTIALITY: You and/or your care partner have signed paperwork which will be entered into your electronic medical record.  These signatures attest to the fact that that the information above on your After Visit Summary has been reviewed and is understood.  Full responsibility of the confidentiality of this discharge information lies with you and/or your care-partner. 

## 2019-06-19 NOTE — Progress Notes (Addendum)
Renee Vaughn, [place], CMA - VS   Pt's states no medical or surgical changes since previsit or office visit.  Pt request to not have any anesthia for her colonoscopy today.

## 2019-06-19 NOTE — Progress Notes (Signed)
Report to PACU, RN, vss, BBS= Clear.  

## 2019-06-27 ENCOUNTER — Ambulatory Visit (INDEPENDENT_AMBULATORY_CARE_PROVIDER_SITE_OTHER): Payer: 59 | Admitting: *Deleted

## 2019-06-27 DIAGNOSIS — J309 Allergic rhinitis, unspecified: Secondary | ICD-10-CM

## 2019-07-03 ENCOUNTER — Encounter: Payer: Self-pay | Admitting: Gastroenterology

## 2019-07-04 ENCOUNTER — Ambulatory Visit (INDEPENDENT_AMBULATORY_CARE_PROVIDER_SITE_OTHER): Payer: 59 | Admitting: *Deleted

## 2019-07-04 DIAGNOSIS — J309 Allergic rhinitis, unspecified: Secondary | ICD-10-CM

## 2019-07-10 ENCOUNTER — Ambulatory Visit (INDEPENDENT_AMBULATORY_CARE_PROVIDER_SITE_OTHER): Payer: 59 | Admitting: *Deleted

## 2019-07-10 DIAGNOSIS — J309 Allergic rhinitis, unspecified: Secondary | ICD-10-CM

## 2019-07-25 ENCOUNTER — Ambulatory Visit (INDEPENDENT_AMBULATORY_CARE_PROVIDER_SITE_OTHER): Payer: 59 | Admitting: *Deleted

## 2019-07-25 DIAGNOSIS — J309 Allergic rhinitis, unspecified: Secondary | ICD-10-CM

## 2019-08-01 ENCOUNTER — Ambulatory Visit (INDEPENDENT_AMBULATORY_CARE_PROVIDER_SITE_OTHER): Payer: 59 | Admitting: *Deleted

## 2019-08-01 DIAGNOSIS — J309 Allergic rhinitis, unspecified: Secondary | ICD-10-CM

## 2019-08-05 ENCOUNTER — Ambulatory Visit (INDEPENDENT_AMBULATORY_CARE_PROVIDER_SITE_OTHER): Payer: 59 | Admitting: *Deleted

## 2019-08-05 DIAGNOSIS — J309 Allergic rhinitis, unspecified: Secondary | ICD-10-CM | POA: Diagnosis not present

## 2019-08-22 ENCOUNTER — Ambulatory Visit (INDEPENDENT_AMBULATORY_CARE_PROVIDER_SITE_OTHER): Payer: 59 | Admitting: *Deleted

## 2019-08-22 DIAGNOSIS — J309 Allergic rhinitis, unspecified: Secondary | ICD-10-CM | POA: Diagnosis not present

## 2019-09-12 ENCOUNTER — Ambulatory Visit (INDEPENDENT_AMBULATORY_CARE_PROVIDER_SITE_OTHER): Payer: 59 | Admitting: *Deleted

## 2019-09-12 DIAGNOSIS — J309 Allergic rhinitis, unspecified: Secondary | ICD-10-CM | POA: Diagnosis not present

## 2019-09-16 DIAGNOSIS — J3089 Other allergic rhinitis: Secondary | ICD-10-CM

## 2019-09-17 NOTE — Progress Notes (Signed)
VIAL EXP 09-16-20

## 2019-09-26 ENCOUNTER — Ambulatory Visit (INDEPENDENT_AMBULATORY_CARE_PROVIDER_SITE_OTHER): Payer: 59 | Admitting: *Deleted

## 2019-09-26 DIAGNOSIS — J309 Allergic rhinitis, unspecified: Secondary | ICD-10-CM | POA: Diagnosis not present

## 2019-09-29 ENCOUNTER — Encounter: Payer: Self-pay | Admitting: Allergy and Immunology

## 2019-09-29 ENCOUNTER — Other Ambulatory Visit: Payer: Self-pay

## 2019-09-29 ENCOUNTER — Ambulatory Visit: Payer: 59 | Admitting: Allergy and Immunology

## 2019-09-29 VITALS — BP 142/96 | HR 79 | Temp 97.9°F | Resp 16 | Ht 66.5 in | Wt 209.2 lb

## 2019-09-29 DIAGNOSIS — K219 Gastro-esophageal reflux disease without esophagitis: Secondary | ICD-10-CM | POA: Diagnosis not present

## 2019-09-29 DIAGNOSIS — J0140 Acute pansinusitis, unspecified: Secondary | ICD-10-CM | POA: Diagnosis not present

## 2019-09-29 DIAGNOSIS — J3089 Other allergic rhinitis: Secondary | ICD-10-CM

## 2019-09-29 MED ORDER — PREDNISONE 10 MG PO TABS: ORAL_TABLET | ORAL | 0 refills | Status: DC

## 2019-09-29 MED ORDER — AMOXICILLIN-POT CLAVULANATE 875-125 MG PO TABS: ORAL_TABLET | ORAL | 0 refills | Status: DC

## 2019-09-29 NOTE — Patient Instructions (Addendum)
  1. Continue Nexium 40 mg in a.m. + famotidine 40 in PM if needed    2. Continue Flonase 1-2 sprays each nostril 3-7 times per week depending on disease activity  3. Continue immunotherapy and Epi-Pen / Auvi-Q  4.  Continue antihistamine if needed  5.  Treat most recent episode with the following:   A.  Augmentin 875-1 tablet twice a day for 10 days  B.  Prednisone 10 mg - 1 tablet once a day for 5 days  6. Return to clinic in 12 months or earlier if problem  7.  Obtain Covid vaccine when available

## 2019-09-29 NOTE — Progress Notes (Signed)
Renee Vaughn - Renee Vaughn - Renee Vaughn   Follow-up Note  Referring Provider: Raina Vaughn., MD Primary Provider: Raina Vaughn., MD Date of Office Visit: 09/29/2019  Subjective:   Renee Vaughn (DOB: 10-Feb-1966) is a 53 y.o. female who returns to [facility] 09[facility]/2019 in re-evaluation of the following:  HPI: Renee Vaughn returns to this clinic in reevaluation of allergic rhinitis and LPR.  Her last visit to this clinic was 01 Apr 2018.  Renee Vaughn continues to use immunotherapy currently at every 2 weeks with good effect.  She believes that her upper airway issue is really doing very well with intermittent use of Flonase and some antihistamines.  She has not required a systemic steroid or antibiotic since the very beginning of the year at which Vaughn in time she apparently had a upper respiratory tract infection.  However, over the course of the past 14 days she has developed head pressure and face pain and postnasal drip and is just not feeling well and thinks that she has a sinus infection.  Her reflux is okay while using Nexium 40 mg 1 time per day.  She did attempt to decrease to 20 mg in the morning but unfortunately developed some issues with classic reflux and throat clearing.  She occasionally adds famotidine in the evening.  She did receive the flu vaccine this year.  Allergies as of 09/29/2019      Reactions   Alprazolam Other (See Comments)   Muscle weakness Muscle weakness Muscle weakness   Cyclobenzaprine Other (See Comments)   Muscle weakness Muscle weakness Muscle weakness   Diazepam Other (See Comments)   Muscle weakness Muscle weakness Muscle weakness   Ondansetron Other (See Comments)   Causes disorientation/confusion Causes disorientation/confusion   Dextromethorphan Other (See Comments)   Morphine And Related    Pt complained of palpitations, and chest pain. States that she just didn't feel well after taking it.   Codeine Palpitations   Levofloxacin Palpitations   Moxifloxacin Hcl In Nacl Palpitations   Sulfa Antibiotics Palpitations      Medication List    bumetanide 1 MG tablet Commonly known as: BUMEX Take by mouth.   esomeprazole 40 MG capsule Commonly known as: NEXIUM Take by mouth at bedtime.   famotidine 20 MG tablet Commonly known as: PEPCID Take 20 mg by mouth as needed for heartburn or indigestion.   fluticasone 50 MCG/ACT nasal spray Commonly known as: FLONASE Place 1 spray into both nostrils daily.   meclizine 25 MG tablet Commonly known as: ANTIVERT   PROBIOTIC PO Take by mouth.   propranolol ER 60 MG 24 hr capsule Commonly known as: INDERAL LA 60 mg daily.       Past Medical History:  Diagnosis Date  . Anxiety   . Colon polyps   . Diverticulosis   . Endometriosis   . FH: colon polyps   . Fibromyalgia   . GERD (gastroesophageal reflux disease)   . Hiatal hernia   . History of colon polyps   . History of Helicobacter pylori infection   . Hypertension   . IBS (irritable bowel syndrome)   . Immune reconstitution syndrome (Renee Vaughn)   . Internal hemorrhoids   . MVP (mitral valve prolapse)   . Type 2 diabetes mellitus (HCC)    no meds per pt    Past Surgical History:  Procedure Laterality Date  . COLONOSCOPY  04/09/2015   Colonic polyp, status post polypectomy. Mild sigmoid  diverticulosis. Small internal hemorrhoids. OTherwise normal colonoscopy.   Marland Kitchen episiotomy  1984   During childbirth.     Review of systems negative except as noted in HPI / PMHx or noted below:  Review of Systems  Constitutional: Negative.   HENT: Negative.   Eyes: Negative.   Respiratory: Negative.   Cardiovascular: Negative.   Gastrointestinal: Negative.   Genitourinary: Negative.   Musculoskeletal: Negative.   Skin: Negative.   Neurological: Negative.   Endo/Heme/Allergies: Negative.   Psychiatric/Behavioral: Negative.      Objective:   Vitals:   09/29/19 1722  BP:  (!) 142/96  Pulse: 79  Resp: 16  Temp: 97.9 F (36.6 C)  SpO2: 98%   Height: 5' 6.5" (168.9 cm)  Weight: 209 lb 3.2 oz (94.9 kg)   Physical Exam Constitutional:      Appearance: She is not diaphoretic.  HENT:     Head: Normocephalic.     Right Ear: Tympanic membrane, ear canal and external ear normal.     Left Ear: Tympanic membrane, ear canal and external ear normal.     Nose: Nose normal. No mucosal edema or rhinorrhea.     Mouth/Throat:     Pharynx: Uvula midline. No oropharyngeal exudate.  Eyes:     Conjunctiva/sclera: Conjunctivae normal.  Neck:     Thyroid: No thyromegaly.     Trachea: Trachea normal. No tracheal tenderness or tracheal deviation.  Cardiovascular:     Rate and Rhythm: Normal rate and regular rhythm.     Heart sounds: Normal heart sounds, S1 normal and S2 normal. No murmur.  Pulmonary:     Effort: No respiratory distress.     Breath sounds: Normal breath sounds. No stridor. No wheezing or rales.  Lymphadenopathy:     Head:     Right side of head: No tonsillar adenopathy.     Left side of head: No tonsillar adenopathy.     Cervical: No cervical adenopathy.  Skin:    Findings: No erythema or rash.     Nails: There is no clubbing.   Neurological:     Mental Status: She is alert.     Diagnostics: none  Assessment and Plan:   1. Other allergic rhinitis   2. Acute pansinusitis, recurrence not specified   3. LPRD (laryngopharyngeal reflux disease)      1. Continue Nexium 40 mg in a.m. + famotidine 40 in PM if needed    2. Continue Flonase 1-2 sprays each nostril 3-7 times per week depending on disease activity  3. Continue immunotherapy and Epi-Pen / Auvi-Q  4.  Continue antihistamine if needed  5.  Treat most recent episode with the following:   A.  Augmentin 875-1 tablet twice a day for 10 days  B.  Prednisone 10 mg - 1 tablet once a day for 5 days  6. Return to clinic in 12 months or earlier if problem  7.  Obtain Covid vaccine  when available  Renee Vaughn appears to have been doing quite well on her current plan of action which includes immunotherapy but currently has a sinus infection which we will treat with the plan noted above.  She will maintain therapy directed against inflammation of her airway and therapy against reflux as noted above.  Assuming she does well I will see her back in his clinic in 1 year or earlier if there is a problem.  Allena Katz, MD Allergy / Immunology [place]

## 2019-09-30 ENCOUNTER — Encounter: Payer: Self-pay | Admitting: Allergy and Immunology

## 2019-10-13 ENCOUNTER — Ambulatory Visit (INDEPENDENT_AMBULATORY_CARE_PROVIDER_SITE_OTHER): Payer: 59

## 2019-10-13 DIAGNOSIS — J309 Allergic rhinitis, unspecified: Secondary | ICD-10-CM

## 2019-11-03 ENCOUNTER — Ambulatory Visit (INDEPENDENT_AMBULATORY_CARE_PROVIDER_SITE_OTHER): Payer: 59 | Admitting: *Deleted

## 2019-11-03 DIAGNOSIS — J309 Allergic rhinitis, unspecified: Secondary | ICD-10-CM | POA: Diagnosis not present

## 2019-11-27 ENCOUNTER — Ambulatory Visit (INDEPENDENT_AMBULATORY_CARE_PROVIDER_SITE_OTHER): Payer: 59 | Admitting: *Deleted

## 2019-11-27 DIAGNOSIS — J309 Allergic rhinitis, unspecified: Secondary | ICD-10-CM

## 2019-12-22 ENCOUNTER — Ambulatory Visit (INDEPENDENT_AMBULATORY_CARE_PROVIDER_SITE_OTHER): Payer: 59 | Admitting: *Deleted

## 2019-12-22 DIAGNOSIS — J309 Allergic rhinitis, unspecified: Secondary | ICD-10-CM

## 2020-01-14 ENCOUNTER — Ambulatory Visit (INDEPENDENT_AMBULATORY_CARE_PROVIDER_SITE_OTHER): Payer: 59 | Admitting: *Deleted

## 2020-01-14 DIAGNOSIS — J309 Allergic rhinitis, unspecified: Secondary | ICD-10-CM | POA: Diagnosis not present

## 2020-01-30 ENCOUNTER — Ambulatory Visit (INDEPENDENT_AMBULATORY_CARE_PROVIDER_SITE_OTHER): Payer: 59 | Admitting: *Deleted

## 2020-01-30 DIAGNOSIS — J309 Allergic rhinitis, unspecified: Secondary | ICD-10-CM | POA: Diagnosis not present

## 2020-02-16 ENCOUNTER — Ambulatory Visit (INDEPENDENT_AMBULATORY_CARE_PROVIDER_SITE_OTHER): Payer: 59 | Admitting: *Deleted

## 2020-02-16 DIAGNOSIS — J309 Allergic rhinitis, unspecified: Secondary | ICD-10-CM

## 2020-03-15 ENCOUNTER — Ambulatory Visit (INDEPENDENT_AMBULATORY_CARE_PROVIDER_SITE_OTHER): Payer: 59 | Admitting: *Deleted

## 2020-03-15 DIAGNOSIS — J309 Allergic rhinitis, unspecified: Secondary | ICD-10-CM

## 2020-04-01 ENCOUNTER — Ambulatory Visit (INDEPENDENT_AMBULATORY_CARE_PROVIDER_SITE_OTHER): Payer: 59

## 2020-04-01 DIAGNOSIS — J309 Allergic rhinitis, unspecified: Secondary | ICD-10-CM

## 2020-04-19 ENCOUNTER — Telehealth: Payer: Self-pay | Admitting: Allergy and Immunology

## 2020-04-19 NOTE — Telephone Encounter (Signed)
She can try OTC Rhinocort or Nasacort. They all will take a week or so to start working

## 2020-04-19 NOTE — Telephone Encounter (Signed)
Renee Vaughn called in and states she has been using over the counter fluticasone.  Renee Vaughn states it's not doing the best job and wants to know if there is a better nose spray she can use?  Please advise.

## 2020-04-19 NOTE — Telephone Encounter (Signed)
Left message to call the office.

## 2020-04-20 NOTE — Telephone Encounter (Signed)
I have spoken with Renee Vaughn and she will try Nasacort and will call us after a week to let us know how she is doing.

## 2020-04-28 ENCOUNTER — Encounter: Payer: Self-pay | Admitting: Allergy and Immunology

## 2020-04-28 ENCOUNTER — Other Ambulatory Visit: Payer: Self-pay

## 2020-04-28 ENCOUNTER — Ambulatory Visit: Payer: 59 | Admitting: Allergy and Immunology

## 2020-04-28 VITALS — BP 130/88 | HR 68 | Resp 16

## 2020-04-28 DIAGNOSIS — G4720 Circadian rhythm sleep disorder, unspecified type: Secondary | ICD-10-CM

## 2020-04-28 DIAGNOSIS — I10 Essential (primary) hypertension: Secondary | ICD-10-CM

## 2020-04-28 DIAGNOSIS — J3089 Other allergic rhinitis: Secondary | ICD-10-CM | POA: Diagnosis not present

## 2020-04-28 DIAGNOSIS — K219 Gastro-esophageal reflux disease without esophagitis: Secondary | ICD-10-CM

## 2020-04-28 MED ORDER — AZELASTINE-FLUTICASONE 137-50 MCG/ACT NA SUSP: NASAL | 5 refills | Status: DC

## 2020-04-28 NOTE — Patient Instructions (Addendum)
  1.  Continue Nexium 40 mg in a.m. + famotidine 40 in PM if needed    2.  Start Dymista - 1 spray each nostril 2 times per day ( Replaces Flonase)  3.  Continue immunotherapy and Epi-Pen / Auvi-Q  4.  Continue antihistamine if needed  5.  Obtain Nocturnal oximetry study  6.  Further evaluation?

## 2020-04-28 NOTE — Progress Notes (Signed)
[facility] [place] [facility] [place]   Follow-up Note  Referring Provider: Raina Mina., MD Primary Provider: Raina Mina., MD Date of Office Visit: 04/28/2020  Subjective:   Renee Vaughn (DOB: 1966/03/14) is a 54 y.o. female who returns to the [facility] on 04/28/2020 in re-evaluation of the following:  HPI: Brittlynn returns to this clinic in evaluation of allergic rhinitis and LPR.  Her last visit to this clinic was 29 September 2019.  She apparently was doing quite well with both her atopic respiratory disease and LPR for a prolonged period in time but she received the Covid vaccine in April 2021 and 10 days later she had rather dramatic spike in her blood pressure to the point where she required 2 emergency room evaluations for headache and increased blood pressure and 3 visit with her primary care doctor with a multitude of different medical combinations having settled now on propranolol and losartan and Lasix with relatively good control of her blood pressure.  But she has been having rather significant fatigue and intermittent dizziness since that event and she has been having some face fullness and pain and some occasional headache and nasal congestion and just not feeling well in general.  She apparently was given Augmentin and prednisone for possible sinus infection which she finished this week and she is not really sure that this is helped very much.  She does not really have a tremendous amount of respiratory tract symptoms other than some intermittent burning in her nose and she has some fluid like sensations intermittently in her left ear.  Her ability to smell has been diminished for several years although she can smell food on occasion.  She has very "bad sleep" which appears to be fractured throughout the night.  She does not really consume caffeine on a regular basis.  She has never had a sleep study.  She thinks that her reflux is  okay while using Nexium on a consistent basis and she occasionally adds famotidine.  Allergies as of 04/28/2020      Reactions   Alprazolam Other (See Comments)   Muscle weakness Muscle weakness Muscle weakness   Cyclobenzaprine Other (See Comments)   Muscle weakness Muscle weakness Muscle weakness   Diazepam Other (See Comments)   Muscle weakness Muscle weakness Muscle weakness   Ondansetron Other (See Comments)   Causes disorientation/confusion Causes disorientation/confusion   Dextromethorphan Other (See Comments)   Morphine And Related    Pt complained of palpitations, and chest pain. States that she just didn't feel well after taking it.   Codeine Palpitations   Levofloxacin Palpitations   Moxifloxacin Hcl In Nacl Palpitations   Sulfa Antibiotics Palpitations      Medication List    amoxicillin-clavulanate 875-125 MG tablet Commonly known as: Augmentin Take 1 tablet by mouth twice daily for 10 days   esomeprazole 40 MG capsule Commonly known as: NEXIUM Take by mouth at bedtime.   famotidine 20 MG tablet Commonly known as: PEPCID Take 20 mg by mouth as needed for heartburn or indigestion.   ferrous sulfate 325 (65 FE) MG EC tablet Take by mouth.   furosemide 20 MG tablet Commonly known as: LASIX Take 10 mg by mouth.   losartan 50 MG tablet Commonly known as: COZAAR Take 25 mg by mouth daily.   meclizine 25 MG tablet Commonly known as: ANTIVERT   Nasacort Allergy 24HR 55 MCG/ACT Aero nasal inhaler Generic drug: triamcinolone Place 2  sprays into the nose daily.   PROBIOTIC PO Take by mouth.   propranolol ER 60 MG 24 hr capsule Commonly known as: INDERAL LA 60 mg daily.       Past Medical History:  Diagnosis Date   Anxiety    Colon polyps    Diverticulosis    Endometriosis    FH: colon polyps    Fibromyalgia    GERD (gastroesophageal reflux disease)    Hiatal hernia    History of colon polyps    History of Helicobacter pylori  infection    Hypertension    IBS (irritable bowel syndrome)    Immune reconstitution syndrome ([place])    Internal hemorrhoids    MVP (mitral valve prolapse)    Type 2 diabetes mellitus ([place])    no meds per pt    Past Surgical History:  Procedure Laterality Date   COLONOSCOPY  04/09/2015   Colonic polyp, status post polypectomy. Mild sigmoid diverticulosis. Small internal hemorrhoids. OTherwise normal colonoscopy.    episiotomy  1984   During childbirth.     Review of systems negative except as noted in HPI / PMHx or noted below:  Review of Systems  Constitutional: Negative.   HENT: Negative.   Eyes: Negative.   Respiratory: Negative.   Cardiovascular: Negative.   Gastrointestinal: Negative.   Genitourinary: Negative.   Musculoskeletal: Negative.   Skin: Negative.   Neurological: Negative.   Endo/Heme/Allergies: Negative.   Psychiatric/Behavioral: Negative.      Objective:   Vitals:   04/28/20 1619  BP: 130/88  Pulse: 68  Resp: 16  SpO2: 98%          Physical Exam Constitutional:      Appearance: She is not diaphoretic.  HENT:     Head: Normocephalic.     Right Ear: Tympanic membrane, ear canal and external ear normal.     Left Ear: Tympanic membrane, ear canal and external ear normal.     Nose: Nose normal. No mucosal edema or rhinorrhea.     Mouth/Throat:     Pharynx: Uvula midline. No oropharyngeal exudate.  Eyes:     Conjunctiva/sclera: Conjunctivae normal.  Neck:     Thyroid: No thyromegaly.     Trachea: Trachea normal. No tracheal tenderness or tracheal deviation.  Cardiovascular:     Rate and Rhythm: Normal rate and regular rhythm.     Heart sounds: Normal heart sounds, S1 normal and S2 normal. No murmur.  Pulmonary:     Effort: No respiratory distress.     Breath sounds: Normal breath sounds. No stridor. No wheezing or rales.  Lymphadenopathy:     Head:     Right side of head: No tonsillar adenopathy.     Left side of head: No  tonsillar adenopathy.     Cervical: No cervical adenopathy.  Skin:    Findings: No erythema or rash.     Nails: There is no clubbing.  Neurological:     Mental Status: She is alert.     Diagnostics: none  Assessment and Plan:   1. Other allergic rhinitis   2. LPRD (laryngopharyngeal reflux disease)   3. Dysfunction of sleep stage or arousal   4. Essential hypertension      1.  Continue Nexium 40 mg in a.m. + famotidine 40 in PM if needed    2.  Start Dymista - 1 spray each nostril 2 times per day (Replaces Flonase)  3.  Continue immunotherapy and Epi-Pen / Auvi-Q  4.  Continue antihistamine if needed  5.  Obtain Nocturnal oximetry study  6.  Further evaluation?  We will have Darinka use a combination nasal inhaler with a nasal steroid and a nasal antihistamine in conjunction with immunotherapy to address her upper airway complaints and she will continue to use therapy directed against reflux/LPR.  Given her difficult to control hypertension and the fact that she has very fractured sleep and fatigue during the daytime I think we need to consider the possibility that she has unrecognized sleep apnea.  We will initially screen for possible nocturnal deoxygenation with a nocturnal oximetry study and follow-up with further evaluation for possible sleep apnea pending the results of this test.  Allena Katz, MD Allergy / [place]

## 2020-04-29 ENCOUNTER — Encounter: Payer: Self-pay | Admitting: Allergy and Immunology

## 2020-04-30 ENCOUNTER — Ambulatory Visit (INDEPENDENT_AMBULATORY_CARE_PROVIDER_SITE_OTHER): Payer: 59 | Admitting: *Deleted

## 2020-04-30 DIAGNOSIS — J309 Allergic rhinitis, unspecified: Secondary | ICD-10-CM

## 2020-04-30 MED ORDER — FLUTICASONE PROPIONATE 50 MCG/ACT NA SUSP: NASAL | 5 refills | Status: AC

## 2020-04-30 MED ORDER — AZELASTINE HCL 0.1 % NA SOLN: NASAL | 5 refills | Status: DC

## 2020-05-04 ENCOUNTER — Other Ambulatory Visit: Payer: Self-pay

## 2020-05-04 MED ORDER — AUVI-Q 0.3 MG/0.3ML IJ SOAJ: INTRAMUSCULAR | 3 refills | Status: DC

## 2020-05-07 ENCOUNTER — Other Ambulatory Visit: Payer: Self-pay

## 2020-05-07 MED ORDER — EPINEPHRINE 0.3 MG/0.3ML IJ SOAJ: INTRAMUSCULAR | 3 refills | Status: AC

## 2020-05-14 ENCOUNTER — Telehealth: Payer: Self-pay

## 2020-05-14 ENCOUNTER — Encounter: Payer: Self-pay | Admitting: *Deleted

## 2020-05-14 NOTE — Telephone Encounter (Signed)
Left message to call the office.  Please inform patient that Dr.Ko

## 2020-05-14 NOTE — Telephone Encounter (Signed)
Left message for patient to call the office.  Please inform patient that Dr.Kozlow received her overnight oximetry report and that it shows that her oxygen is good at night. Will scan results into Epic.  * Please disregard previous telephone documentation- technical difficulties-

## 2020-05-14 NOTE — Telephone Encounter (Signed)
Informed of results.  

## 2020-05-18 ENCOUNTER — Ambulatory Visit (INDEPENDENT_AMBULATORY_CARE_PROVIDER_SITE_OTHER): Payer: 59 | Admitting: *Deleted

## 2020-05-18 DIAGNOSIS — J309 Allergic rhinitis, unspecified: Secondary | ICD-10-CM

## 2020-06-07 ENCOUNTER — Ambulatory Visit (INDEPENDENT_AMBULATORY_CARE_PROVIDER_SITE_OTHER): Payer: 59 | Admitting: *Deleted

## 2020-06-07 DIAGNOSIS — J309 Allergic rhinitis, unspecified: Secondary | ICD-10-CM | POA: Diagnosis not present

## 2020-06-28 ENCOUNTER — Ambulatory Visit (INDEPENDENT_AMBULATORY_CARE_PROVIDER_SITE_OTHER): Payer: 59 | Admitting: *Deleted

## 2020-06-28 DIAGNOSIS — J309 Allergic rhinitis, unspecified: Secondary | ICD-10-CM | POA: Diagnosis not present

## 2020-07-12 ENCOUNTER — Other Ambulatory Visit: Payer: Self-pay | Admitting: Gastroenterology

## 2020-07-16 ENCOUNTER — Other Ambulatory Visit: Payer: Self-pay

## 2020-07-16 MED ORDER — HYDROCORTISONE ACETATE 25 MG RE SUPP: 25.0000 mg | Freq: Two times a day (BID) | RECTAL | 11 refills | Status: DC

## 2020-07-23 ENCOUNTER — Ambulatory Visit (INDEPENDENT_AMBULATORY_CARE_PROVIDER_SITE_OTHER): Payer: 59 | Admitting: *Deleted

## 2020-07-23 DIAGNOSIS — J309 Allergic rhinitis, unspecified: Secondary | ICD-10-CM

## 2020-08-09 ENCOUNTER — Ambulatory Visit (INDEPENDENT_AMBULATORY_CARE_PROVIDER_SITE_OTHER): Payer: 59 | Admitting: *Deleted

## 2020-08-09 DIAGNOSIS — J309 Allergic rhinitis, unspecified: Secondary | ICD-10-CM

## 2020-08-23 ENCOUNTER — Ambulatory Visit (INDEPENDENT_AMBULATORY_CARE_PROVIDER_SITE_OTHER): Payer: 59

## 2020-08-23 DIAGNOSIS — J309 Allergic rhinitis, unspecified: Secondary | ICD-10-CM | POA: Diagnosis not present

## 2020-08-30 DIAGNOSIS — J3089 Other allergic rhinitis: Secondary | ICD-10-CM | POA: Diagnosis not present

## 2020-08-30 NOTE — Progress Notes (Signed)
Vial exp 08-30-21

## 2020-09-14 ENCOUNTER — Ambulatory Visit (INDEPENDENT_AMBULATORY_CARE_PROVIDER_SITE_OTHER): Payer: 59 | Admitting: *Deleted

## 2020-09-14 DIAGNOSIS — J309 Allergic rhinitis, unspecified: Secondary | ICD-10-CM

## 2020-09-17 ENCOUNTER — Encounter: Payer: Self-pay | Admitting: Gastroenterology

## 2020-09-17 ENCOUNTER — Ambulatory Visit: Payer: 59 | Admitting: Gastroenterology

## 2020-09-17 VITALS — BP 118/80 | HR 82 | Ht 67.0 in | Wt 202.2 lb

## 2020-09-17 DIAGNOSIS — K219 Gastro-esophageal reflux disease without esophagitis: Secondary | ICD-10-CM | POA: Diagnosis not present

## 2020-09-17 DIAGNOSIS — K449 Diaphragmatic hernia without obstruction or gangrene: Secondary | ICD-10-CM | POA: Diagnosis not present

## 2020-09-17 MED ORDER — ESOMEPRAZOLE MAGNESIUM 40 MG PO CPDR: 40.0000 mg | DELAYED_RELEASE_CAPSULE | Freq: Every day | ORAL | 3 refills | Status: DC

## 2020-09-17 NOTE — Patient Instructions (Signed)
You have a recall colon in 2025.  You will receive a letter reminding you to call our office.  We have sent the following medications to your pharmacy for you to pick up at your convenience:  Nexium  Please follow up as needed

## 2020-09-17 NOTE — Progress Notes (Signed)
Chief Complaint: Change in bowel habits  Referring Provider:  Raina Mina., MD      ASSESSMENT AND PLAN;   #1. IBS-C. neg colon 05/2019 #2. FH colonic polyps (mom and GM >60). Neg colon 05/2019. Rpt in 5 yrs not 10 yrs. #3. GERD with small HH  Plan: - Nexium 40mg  po qd #90, 4 refills ([facility]) - Minimize pain medications including OTC NSAIDs. - Start an exercising-like walking for at least 15-20 min/day and try to reduce weight. - Continue Miralax 17g po qd. - Rpt colon 05/2024 d/t FH of colonic polyps.     HPI:    Renee Vaughn is a 54 y.o. female   For follow-up visit  Doing really well.  No complaints except occasional constipation, better with MiraLAX.  No abdominal pain.  No nausea, vomiting, heartburn (on nexium), regurgitation, odynophagia or dysphagia.  No significant diarrhea. There is no melena or hematochezia. No unintentional weight loss.  No jaundice dark urine or pale stools.   Past GI procedures: Colonoscopy 06/19/2019  -Diminutive colonic polyp status post polypectomy. -Mild sigmoid diverticulosis. -Non-bleeding internal hemorrhoids. -Otherwise normal colonoscopy to TI. Bx-hyperplastic  Colonoscopy 04/09/2015 ([facility]) colonic polyp status post polypectomy, mild sigmoid diverticulosis.  Otherwise normal to TI. Bx- TA.  -H/O biliary pancreatitis. ERCP 01/11/2016 [facility]-biliary sphincterotomy s/p balloon sweep with extraction of biliary sludge.  Thereafter, she refused lap cholecystectomy as she was scared of anesthesia.  -Upper GI series 03/28/2013: Small hiatal hernia Past Medical History:  Diagnosis Date  . Anxiety   . Colon polyps   . Diverticulosis   . Endometriosis   . FH: colon polyps   . Fibromyalgia   . GERD (gastroesophageal reflux disease)   . Hiatal hernia   . History of colon polyps   . History of Helicobacter pylori infection   . Hypertension   . IBS (irritable bowel syndrome)   . Immune reconstitution syndrome ([place])   .  Internal hemorrhoids   . MVP (mitral valve prolapse)   . Type 2 diabetes mellitus ([facility])    no meds per pt    Past Surgical History:  Procedure Laterality Date  . COLONOSCOPY  04/09/2015   Colonic polyp, status post polypectomy. Mild sigmoid diverticulosis. Small internal hemorrhoids. OTherwise normal colonoscopy.   Marland Kitchen episiotomy  1984   During childbirth.     Family History  Problem Relation Age of Onset  . Asthma Mother   . Diabetes Mother   . High blood pressure Mother   . COPD Mother   . Prolactinoma Mother   . Colon polyps Mother   . Diabetes Father   . High blood pressure Father   . Asthma Sister   . Atrial fibrillation Maternal Grandmother   . Asthma Son   . Asthma Son   . Other Son        Eosinophilic Esophagitis  . Other Half-Sister        removed laryx and thyroid  . Throat cancer Half-Sister   . Colon cancer Neg Hx   . Esophageal cancer Neg Hx   . Rectal cancer Neg Hx   . Stomach cancer Neg Hx     Social History   Tobacco Use  . Smoking status: Never Smoker  . Smokeless tobacco: Never Used  Vaping Use  . Vaping Use: Never used  Substance Use Topics  . Alcohol use: Never  . Drug use: Never    Current Outpatient Medications  Medication Sig Dispense Refill  .  azelastine (ASTELIN) 0.1 % nasal spray Use one spray in each nostril twice daily 30 mL 5  . Azelastine-Fluticasone 137-50 MCG/ACT SUSP Use one spray in each nostril twice daily as directed. 23 g 5  . EPINEPHrine 0.3 mg/0.3 mL IJ SOAJ injection Use as directed for life-threatening allergic reaction. 1 each 3  . esomeprazole (NEXIUM) 40 MG capsule Take by mouth at bedtime.    . famotidine (PEPCID) 20 MG tablet Take 20 mg by mouth as needed for heartburn or indigestion.    . ferrous sulfate 325 (65 FE) MG EC tablet Take by mouth.    . fluticasone (FLONASE) 50 MCG/ACT nasal spray Use one spray in each nostril twice daily 16 g 5  . furosemide (LASIX) 20 MG tablet Take 10 mg by mouth.     .  hydrocortisone (ANUSOL-HC) 25 MG suppository Place 1 suppository (25 mg total) rectally every 12 (twelve) hours. 60 suppository 11  . losartan (COZAAR) 50 MG tablet Take 25 mg by mouth daily.     . meclizine (ANTIVERT) 25 MG tablet     . Probiotic Product (PROBIOTIC PO) Take by mouth.    . propranolol ER (INDERAL LA) 60 MG 24 hr capsule 60 mg daily.    Marland Kitchen triamcinolone (NASACORT ALLERGY 24HR) 55 MCG/ACT AERO nasal inhaler Place 2 sprays into the nose daily.    . cefdinir (OMNICEF) 300 MG capsule  (Patient not taking: Reported on 09/17/2020)     No current facility-administered medications for this visit.    Allergies  Allergen Reactions  . Alprazolam Other (See Comments)    Muscle weakness Muscle weakness Muscle weakness   . Cyclobenzaprine Other (See Comments)    Muscle weakness Muscle weakness Muscle weakness   . Diazepam Other (See Comments)    Muscle weakness Muscle weakness Muscle weakness   . Ondansetron Other (See Comments)    Causes disorientation/confusion Causes disorientation/confusion   . Dextromethorphan Other (See Comments)  . Morphine And Related     Pt complained of palpitations, and chest pain. States that she just didn't feel well after taking it.  . Codeine Palpitations  . Levofloxacin Palpitations  . Moxifloxacin Hcl In Nacl Palpitations  . Sulfa Antibiotics Palpitations    Review of Systems:  neg     Physical Exam:    BP 118/80   Pulse 82   Ht 5\' 7"  (1.702 m)   Wt 202 lb 4 oz (91.7 kg)   BMI 31.68 kg/m  Filed Weights   09/17/20 1440  Weight: 202 lb 4 oz (91.7 kg)  Gen: awake, alert, NAD HEENT: anicteric, no pallor CV: RRR, no mrg Pulm: CTA b/l Abd: soft, NT/ND, +BS throughout Ext: no c/c/e Neuro: nonfocal     Carmell Austria, MD 09/17/2020, 3:05 PM  Cc: Raina Mina., MD

## 2020-10-04 ENCOUNTER — Ambulatory Visit (INDEPENDENT_AMBULATORY_CARE_PROVIDER_SITE_OTHER): Payer: 59 | Admitting: *Deleted

## 2020-10-04 DIAGNOSIS — J309 Allergic rhinitis, unspecified: Secondary | ICD-10-CM | POA: Diagnosis not present

## 2020-10-20 ENCOUNTER — Ambulatory Visit (INDEPENDENT_AMBULATORY_CARE_PROVIDER_SITE_OTHER): Payer: 59 | Admitting: *Deleted

## 2020-10-20 DIAGNOSIS — J309 Allergic rhinitis, unspecified: Secondary | ICD-10-CM | POA: Diagnosis not present

## 2020-11-04 ENCOUNTER — Ambulatory Visit (INDEPENDENT_AMBULATORY_CARE_PROVIDER_SITE_OTHER): Payer: BC Managed Care – PPO | Admitting: *Deleted

## 2020-11-04 DIAGNOSIS — J309 Allergic rhinitis, unspecified: Secondary | ICD-10-CM | POA: Diagnosis not present

## 2020-11-30 ENCOUNTER — Ambulatory Visit (INDEPENDENT_AMBULATORY_CARE_PROVIDER_SITE_OTHER): Payer: BC Managed Care – PPO

## 2020-11-30 DIAGNOSIS — J309 Allergic rhinitis, unspecified: Secondary | ICD-10-CM | POA: Diagnosis not present

## 2021-01-13 ENCOUNTER — Ambulatory Visit (INDEPENDENT_AMBULATORY_CARE_PROVIDER_SITE_OTHER): Payer: BC Managed Care – PPO | Admitting: *Deleted

## 2021-01-13 DIAGNOSIS — J309 Allergic rhinitis, unspecified: Secondary | ICD-10-CM | POA: Diagnosis not present

## 2021-01-24 ENCOUNTER — Ambulatory Visit (INDEPENDENT_AMBULATORY_CARE_PROVIDER_SITE_OTHER): Payer: BC Managed Care – PPO | Admitting: *Deleted

## 2021-01-24 DIAGNOSIS — J309 Allergic rhinitis, unspecified: Secondary | ICD-10-CM | POA: Diagnosis not present

## 2021-02-14 ENCOUNTER — Ambulatory Visit (INDEPENDENT_AMBULATORY_CARE_PROVIDER_SITE_OTHER): Payer: BC Managed Care – PPO | Admitting: *Deleted

## 2021-02-14 DIAGNOSIS — J309 Allergic rhinitis, unspecified: Secondary | ICD-10-CM | POA: Diagnosis not present

## 2021-02-28 ENCOUNTER — Ambulatory Visit (INDEPENDENT_AMBULATORY_CARE_PROVIDER_SITE_OTHER): Payer: BC Managed Care – PPO | Admitting: *Deleted

## 2021-02-28 DIAGNOSIS — J309 Allergic rhinitis, unspecified: Secondary | ICD-10-CM | POA: Diagnosis not present

## 2021-03-14 ENCOUNTER — Ambulatory Visit (INDEPENDENT_AMBULATORY_CARE_PROVIDER_SITE_OTHER): Payer: BC Managed Care – PPO | Admitting: *Deleted

## 2021-03-14 DIAGNOSIS — J309 Allergic rhinitis, unspecified: Secondary | ICD-10-CM | POA: Diagnosis not present

## 2021-04-11 ENCOUNTER — Ambulatory Visit (INDEPENDENT_AMBULATORY_CARE_PROVIDER_SITE_OTHER): Payer: BC Managed Care – PPO | Admitting: *Deleted

## 2021-04-11 DIAGNOSIS — J309 Allergic rhinitis, unspecified: Secondary | ICD-10-CM | POA: Diagnosis not present

## 2021-05-05 DIAGNOSIS — J3089 Other allergic rhinitis: Secondary | ICD-10-CM | POA: Diagnosis not present

## 2021-05-05 NOTE — Progress Notes (Signed)
VIAL EXP 05-05-22

## 2021-05-19 ENCOUNTER — Telehealth: Payer: BC Managed Care – PPO | Admitting: Allergy and Immunology

## 2021-05-19 MED ORDER — AZELASTINE HCL 0.1 % NA SOLN: NASAL | 0 refills | Status: DC

## 2021-05-19 NOTE — Telephone Encounter (Signed)
Patient states she is out of the Azelastine nasal spray and needs a refill sent in.   [facility] [address]

## 2021-05-19 NOTE — Telephone Encounter (Signed)
1 courtesy refill was sent to requested pharmacy. She needs to schedule an office visit for additional refills. Last seen 04/2020.   Could you please call patient to schedule an appointment?

## 2021-05-23 NOTE — Telephone Encounter (Signed)
OV already scheduled.

## 2021-06-16 ENCOUNTER — Ambulatory Visit: Payer: BC Managed Care – PPO | Admitting: Allergy and Immunology

## 2021-06-30 ENCOUNTER — Ambulatory Visit: Payer: BC Managed Care – PPO | Admitting: Allergy and Immunology

## 2021-06-30 ENCOUNTER — Encounter: Payer: Self-pay | Admitting: Allergy and Immunology

## 2021-06-30 ENCOUNTER — Other Ambulatory Visit: Payer: Self-pay

## 2021-06-30 VITALS — BP 144/96 | HR 82 | Resp 18 | Wt 192.4 lb

## 2021-06-30 DIAGNOSIS — R5383 Other fatigue: Secondary | ICD-10-CM | POA: Diagnosis not present

## 2021-06-30 DIAGNOSIS — E0580 Other thyrotoxicosis without thyrotoxic crisis or storm: Secondary | ICD-10-CM | POA: Diagnosis not present

## 2021-06-30 DIAGNOSIS — D472 Monoclonal gammopathy: Secondary | ICD-10-CM | POA: Diagnosis not present

## 2021-06-30 NOTE — Patient Instructions (Addendum)
  Obtain 24-hour urine collection for cortisol and light chains.

## 2021-06-30 NOTE — Progress Notes (Signed)
[facility] [place] [facility] [place]   Follow-up Note  Referring Provider: Raina Mina., MD Primary Provider: Raina Mina., MD Date of Office Visit: 06/30/2021  Subjective:   Renee Vaughn (DOB: [birth year]) is a 55 y.o. female who returns to the [facility] on 06/30/2021 in re-evaluation of the following:  HPI: Renee Vaughn presents to this clinic in evaluation of multiple issues.  I had last seen her in this clinic on 28 April 2020 for an issue with allergic rhinitis and LPR.  The big issue for Janith at this point in time is that she apparently sustained a frequent flulike viral respiratory tract illness 2 months ago that lasted approximately 2 weeks and since that point in time she has had a rather dramatic change in her ability to function.  She has overwhelming fatigue and weakness.   She has had rather significant evaluation by multiple specialties including the recent diagnosis of thyrotoxicosis for which she is under the care of endocrinology and is currently using methimazole.  She has apparently had elevated CRP, elevated sed rate, elevated white blood cell count for which she is going to be seeing a hematologist, and has apparently seen cardiology.  She really does not have a lot of respiratory tract symptoms at this point in time.  Apparently she did have a prolonged cough and some shortness of breath but for the most part that has resolved and she does not really have a tremendous amount of upper airway symptoms.  She has apparently had 3 urinary tract infections since this ordeal started 2 months ago.  Allergies as of 06/30/2021       Reactions   Codeine Palpitations, Shortness Of Breath   Levofloxacin Palpitations, Shortness Of Breath, Other (See Comments)   Other reaction(s): Other (see comments), Other (See Comments) Chest pain and vertigo Chest pain and vertigo Chest pain and vertigo Chest pain and vertigo Chest pain and  vertigo Chest pain and vertigo Chest pain and vertigo Chest pain and vertigo Chest pain and vertigo Chest pain and vertigo   Alprazolam Other (See Comments)   Muscle weakness Muscle weakness Muscle weakness   Cyclobenzaprine Other (See Comments)   Muscle weakness Muscle weakness Muscle weakness   Diazepam Other (See Comments)   Muscle weakness Muscle weakness Muscle weakness   Ondansetron Other (See Comments), Nausea And Vomiting   Causes disorientation/confusion Causes disorientation/confusion Other reaction(s): Other (see comments) Causes disorientation/confusion Causes disorientation/confusion Causes disorientation/confusion Causes disorientation/confusion Causes disorientation/confusion Causes disorientation/confusion Causes disorientation/confusion Causes disorientation/confusion   Dextromethorphan Other (See Comments)   Morphine And Related    Pt complained of palpitations, and chest pain. States that she just didn't feel well after taking it.   Moxifloxacin Hcl In Nacl Palpitations   Sulfa Antibiotics Palpitations        Medication List    azelastine 0.1 % nasal spray Commonly known as: ASTELIN Use one spray in each nostril twice daily   EPINEPHrine 0.3 mg/0.3 mL Soaj injection Commonly known as: EPI-PEN Use as directed for life-threatening allergic reaction.   famotidine 20 MG tablet Commonly known as: PEPCID Take 20 mg by mouth as needed for heartburn or indigestion.   ferrous sulfate 325 (65 FE) MG EC tablet Take by mouth.   fluticasone 50 MCG/ACT nasal spray Commonly known as: FLONASE Use one spray in each nostril twice daily   furosemide 20 MG tablet Commonly known as: LASIX Take 10 mg by mouth.   losartan 50  MG tablet Commonly known as: COZAAR Take 25 mg by mouth daily.   meclizine 25 MG tablet Commonly known as: ANTIVERT   methimazole 10 MG tablet Commonly known as: TAPAZOLE Take by mouth.   pantoprazole 20 MG tablet Commonly  known as: PROTONIX Take 20 mg by mouth daily.   PROBIOTIC PO Take by mouth.   propranolol ER 80 MG 24 hr capsule Commonly known as: INDERAL LA Take 80 mg by mouth daily.    Past Medical History:  Diagnosis Date   Anxiety    Colon polyps    Diverticulosis    Endometriosis    FH: colon polyps    Fibromyalgia    GERD (gastroesophageal reflux disease)    Hiatal hernia    History of colon polyps    History of Helicobacter pylori infection    Hypertension    IBS (irritable bowel syndrome)    Immune reconstitution syndrome ([place])    Internal hemorrhoids    MVP (mitral valve prolapse)    Type 2 diabetes mellitus ([place])    no meds per pt    Past Surgical History:  Procedure Laterality Date   COLONOSCOPY  04/09/2015   Colonic polyp, status post polypectomy. Mild sigmoid diverticulosis. Small internal hemorrhoids. OTherwise normal colonoscopy.    episiotomy  1984   During childbirth.     Review of systems negative except as noted in HPI / PMHx or noted below:  Review of Systems  Constitutional: Negative.   HENT: Negative.    Eyes: Negative.   Respiratory: Negative.    Cardiovascular: Negative.   Gastrointestinal: Negative.   Genitourinary: Negative.   Musculoskeletal: Negative.   Skin: Negative.   Neurological: Negative.   Endo/Heme/Allergies: Negative.   Psychiatric/Behavioral: Negative.      Objective:   Vitals:   06/30/21 1600  BP: (!) 144/96  Pulse: 82  Resp: 18  SpO2: 98%      Weight: 192 lb 6.4 oz (87.3 kg)   Physical Exam Constitutional:      Appearance: She is not diaphoretic.  HENT:     Head: Normocephalic.     Right Ear: Tympanic membrane, ear canal and external ear normal.     Left Ear: Tympanic membrane, ear canal and external ear normal.     Nose: Nose normal. No mucosal edema or rhinorrhea.     Mouth/Throat:     Pharynx: Uvula midline. No oropharyngeal exudate.  Eyes:     Conjunctiva/sclera: Conjunctivae normal.  Neck:     Thyroid: No  thyromegaly.     Trachea: Trachea normal. No tracheal tenderness or tracheal deviation.  Cardiovascular:     Rate and Rhythm: Normal rate and regular rhythm.     Heart sounds: Normal heart sounds, S1 normal and S2 normal. No murmur heard. Pulmonary:     Effort: No respiratory distress.     Breath sounds: Normal breath sounds. No stridor. No wheezing or rales.  Lymphadenopathy:     Head:     Right side of head: No tonsillar adenopathy.     Left side of head: No tonsillar adenopathy.     Cervical: No cervical adenopathy.  Skin:    Findings: No erythema or rash.     Nails: There is no clubbing.  Neurological:     Mental Status: She is alert.    Diagnostics:    Results of blood tests obtained over the course of the past 2 months include TSH less than 0.05 IU/mL, T4 3.4 NG/DL, negative thyroid peroxidase  antibody, negative thyrotropin receptor antibody, AST 13 U/L, ALT 14 U/L, creatinine 0.73 mg/DL, WBC 14.2, absolute eosinophils 0, absolute basophil 0, absolute monocyte 1400, absolute lymphocyte 2600, hemoglobin 11.3, MCV 88.9, platelet 465, negative Lyme disease serology, negative ANA, negative RA screen, immunofixation with possible monoclonal IgG lambda and mid gamma region and monoclonal IgG kappa and far gamma region of very low level, negative CMV serologies, CRP 154.1 mg/L, sed rate 67, CPK 29U/L, troponin less than 0.01 NG/mL, NT-proBNP 70 PG/mL.  Results of a chest CT scan angiography obtained 12 May 2021 identifies normal cardiovascular system with good opacification of central and segmental pulmonary arteries, no significant lymphadenopathy in mediastinum, lungs clear, no pleural effusions, no pneumothorax.  Results of a neck CT scan obtained 11 June 2021 identified no significant abnormality other than a somewhat heterogeneous thyroid gland with mild irregular borders, mastoids and visualized paranasal sinuses clear  Results of a 24-hour urine collection for cortisol identified  6 micrograms/24-hour  Assessment and Plan:   1. Monoclonal gammopathy   2. Other fatigue   3. 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  Tvisha is not doing well and she is not functioning well and is not really a specific etiologic factor responsible for this issue although certainly she appears to have several laboratory abnormalities that are unexplained.  She does have an immunofixation that shows a possible monoclonal gammopathy and we will further quantitate this issue by checking 24-hour urine collection for light chains and she appears to have very significant fatigue and in the past she has had a 24-hour urine collection that identified relatively low levels of cortisol during the 24-hour period and we will follow this up with another 24-hour urine collection.  I will contact her once the results of her tests are available for review.  Allena Katz, MD Allergy / Immunology [place]

## 2021-07-04 ENCOUNTER — Encounter: Payer: Self-pay | Admitting: Allergy and Immunology

## 2021-07-05 ENCOUNTER — Telehealth: Payer: Self-pay | Admitting: *Deleted

## 2021-07-05 NOTE — Telephone Encounter (Signed)
-----   Message from Jiles Prows, MD sent at 07/04/2021  8:01 AM EDT ----- Please inform Renee Vaughn that I looked through all of her studies and she does have some abnormalities.  In August 2027 she did have a 24-hour urine collection that showed low normal levels of cortisol production and I think this needs to be repeated at this point.  In addition, some of her blood test performed over the past 2 months identified a condition called monoclonal gammopathy and we also need to collect her urine to quantitate this issue in more detail.  She needs 24-hour urine collection for cortisol and light chains for monoclonal gammopathy and fatigue.

## 2021-07-06 ENCOUNTER — Other Ambulatory Visit: Payer: Self-pay | Admitting: *Deleted

## 2021-07-06 DIAGNOSIS — D472 Monoclonal gammopathy: Secondary | ICD-10-CM

## 2021-07-06 DIAGNOSIS — R5382 Chronic fatigue, unspecified: Secondary | ICD-10-CM

## 2021-07-06 NOTE — Telephone Encounter (Signed)
Patient informed that we are ordering additional tests. Order form is at the front desk.

## 2021-07-12 ENCOUNTER — Ambulatory Visit: Payer: 59 | Admitting: Gastroenterology

## 2021-07-16 LAB — UPEP/UIFE/LIGHT CHAINS/TP, 24-HR UR
% BETA, Urine: 0 %
ALBUMIN, U: 0 %
ALPHA 1 URINE: 0 %
ALPHA-2-GLOBULIN, U: 0 %
Free Kappa Lt Chains,Ur: 1.64 mg/L (ref 1.17–86.46)
Free Lambda Lt Chains,Ur: <0.67 mg/L (ref 0.27–15.21)
GAMMA GLOBULIN URINE: 0 %
Kappa/Lambda Ratio,U: >2.45 (ref 1.83–14.26)
Protein, 24H Urine: <84 mg/24 hr (ref 30–150)
Protein, Ur: <4 mg/dL

## 2021-07-18 LAB — CORTISOL, URINE, FREE
Cortisol (Ur), Free: 4 ug/24 hr — ABNORMAL LOW (ref 6–42)
Cortisol,F,ug/L,U: 2 ug/L

## 2021-07-19 ENCOUNTER — Other Ambulatory Visit: Payer: Self-pay | Admitting: Gastroenterology

## 2021-07-31 ENCOUNTER — Other Ambulatory Visit: Payer: Self-pay | Admitting: Allergy and Immunology

## 2021-12-05 ENCOUNTER — Ambulatory Visit: Payer: BC Managed Care – PPO | Admitting: Allergy and Immunology

## 2021-12-05 ENCOUNTER — Other Ambulatory Visit: Payer: Self-pay

## 2021-12-05 VITALS — BP 152/94 | HR 68 | Resp 14

## 2021-12-05 DIAGNOSIS — J0140 Acute pansinusitis, unspecified: Secondary | ICD-10-CM | POA: Diagnosis not present

## 2021-12-05 DIAGNOSIS — I10 Essential (primary) hypertension: Secondary | ICD-10-CM | POA: Diagnosis not present

## 2021-12-05 MED ORDER — CEFDINIR 300 MG PO CAPS: ORAL_CAPSULE | ORAL | 0 refills | Status: DC

## 2021-12-05 MED ORDER — METHYLPREDNISOLONE ACETATE 40 MG/ML IJ SUSP
40.0000 mg | Freq: Once | INTRAMUSCULAR | Status: AC
Start: 2021-12-05 — End: 2021-12-05
  Administered 2021-12-05: 40 mg via INTRAMUSCULAR

## 2021-12-05 MED ORDER — AZELASTINE HCL 0.1 % NA SOLN
NASAL | 11 refills | Status: AC
Start: 2021-12-05 — End: ?

## 2021-12-05 NOTE — Progress Notes (Signed)
[name]   Follow-up Note  Referring Provider: Raina Mina., MD Primary Provider: Raina Mina., MD Date of Office Visit: 12/05/2021  Subjective:   Renee Vaughn (DOB: 01-May-1966) is a 56 y.o. female who returns to the [facility] [place] on 12/05/2021 in re-evaluation of the following:  HPI: [name] presents to this clinic in evaluation of of respiratory tract problems.  I have not seen her in this clinic since 30 June 2021.  She is followed in this clinic for allergic rhinitis and LPR.  She was doing pretty well with her airway until November at which point in time she ended up in the urgent care center for an episode of "sinus problems". She was given Augmentin for 1 week which did not work and then she was given another week of Augmentin.  She still has sinus pain and pressure in her head, nasal congestion, altered taste, altered smell, some ear fullness and is just not feeling well in general.  Allergies as of 12/05/2021       Reactions   Codeine Palpitations, Shortness Of Breath   Levofloxacin Palpitations, Shortness Of Breath, Other (See Comments)   Other reaction(s): Other (see comments), Other (See Comments) Chest pain and vertigo Chest pain and vertigo Chest pain and vertigo Chest pain and vertigo Chest pain and vertigo Chest pain and vertigo Chest pain and vertigo Chest pain and vertigo Chest pain and vertigo Chest pain and vertigo   Alprazolam Other (See Comments)   Muscle weakness Muscle weakness Muscle weakness   Cyclobenzaprine Other (See Comments)   Muscle weakness Muscle weakness Muscle weakness   Diazepam Other (See Comments)   Muscle weakness Muscle weakness Muscle weakness   Ondansetron Other (See Comments), Nausea And Vomiting   Causes disorientation/confusion Causes disorientation/confusion Other reaction(s): Other (see comments) Causes disorientation/confusion Causes  disorientation/confusion Causes disorientation/confusion Causes disorientation/confusion Causes disorientation/confusion Causes disorientation/confusion Causes disorientation/confusion Causes disorientation/confusion   Dextromethorphan Other (See Comments)   Morphine And Related    Pt complained of palpitations, and chest pain. States that she just didn't feel well after taking it.   Moxifloxacin Hcl In Nacl Palpitations   Sulfa Antibiotics Palpitations        Medication List    azelastine 0.1 % nasal spray Commonly known as: ASTELIN USE 1 SPRAY IN EACH NOSTRIL TWICE DAILY   COQ10 PO Take by mouth.   EPINEPHrine 0.3 mg/0.3 mL Soaj injection Commonly known as: EPI-PEN Use as directed for life-threatening allergic reaction.   EXCEDRIN PO Take by mouth.   famotidine 20 MG tablet Commonly known as: PEPCID Take 20 mg by mouth as needed for heartburn or indigestion.   ferrous sulfate 325 (65 FE) MG EC tablet Take by mouth.   fluticasone 50 MCG/ACT nasal spray Commonly known as: FLONASE Use one spray in each nostril twice daily   furosemide 20 MG tablet Commonly known as: LASIX Take 10 mg by mouth.   IBUPROFEN PO Take by mouth.   levothyroxine 88 MCG tablet Commonly known as: SYNTHROID Take 88 mcg by mouth daily.   losartan 50 MG tablet Commonly known as: COZAAR Take 25 mg by mouth daily.   meclizine 25 MG tablet Commonly known as: ANTIVERT   NUTRITIONAL SUPPLEMENT PO Take by mouth. Joint health supplement   pantoprazole 40 MG tablet Commonly known as: PROTONIX Take 40 mg by mouth daily.   PROBIOTIC PO Take by mouth.   propranolol ER 120 MG 24  hr capsule Commonly known as: INDERAL LA Take 120 mg by mouth daily.   TURMERIC PO Take by mouth.   TYLENOL PO Take by mouth.        Past Medical History:  Diagnosis Date   Anxiety    Colon polyps    Diverticulosis    Endometriosis    FH: colon polyps    Fibromyalgia    GERD  (gastroesophageal reflux disease)    Hiatal hernia    History of colon polyps    History of Helicobacter pylori infection    Hypertension    IBS (irritable bowel syndrome)    Immune reconstitution syndrome ([place])    Internal hemorrhoids    MVP (mitral valve prolapse)    Type 2 diabetes mellitus ([place])    no meds per pt    Past Surgical History:  Procedure Laterality Date   COLONOSCOPY  04/09/2015   Colonic polyp, status post polypectomy. Mild sigmoid diverticulosis. Small internal hemorrhoids. OTherwise normal colonoscopy.    episiotomy  1984   During childbirth.     Review of systems negative except as noted in HPI / PMHx or noted below:  Review of Systems  Constitutional: Negative.   HENT: Negative.    Eyes: Negative.   Respiratory: Negative.    Cardiovascular: Negative.   Gastrointestinal: Negative.   Genitourinary: Negative.   Musculoskeletal: Negative.   Skin: Negative.   Neurological: Negative.   Endo/Heme/Allergies: Negative.   Psychiatric/Behavioral: Negative.      Objective:   Vitals:   12/05/21 1114 12/05/21 1147  BP: (!) 134/104 (!) 152/94  Pulse: 68   Resp: 14   SpO2: 98%           Physical Exam Constitutional:      Appearance: She is not diaphoretic.  HENT:     Head: Normocephalic.     Right Ear: Tympanic membrane, ear canal and external ear normal.     Left Ear: Tympanic membrane, ear canal and external ear normal.     Nose: Nose normal. No mucosal edema or rhinorrhea.     Mouth/Throat:     Pharynx: Uvula midline. No oropharyngeal exudate.  Eyes:     Conjunctiva/sclera: Conjunctivae normal.  Neck:     Thyroid: No thyromegaly.     Trachea: Trachea normal. No tracheal tenderness or tracheal deviation.  Cardiovascular:     Rate and Rhythm: Normal rate and regular rhythm.     Heart sounds: Normal heart sounds, S1 normal and S2 normal. No murmur heard. Pulmonary:     Effort: No respiratory distress.     Breath sounds: Normal breath sounds.  No stridor. No wheezing or rales.  Lymphadenopathy:     Head:     Right side of head: No tonsillar adenopathy.     Left side of head: No tonsillar adenopathy.     Cervical: No cervical adenopathy.  Skin:    Findings: No erythema or rash.     Nails: There is no clubbing.  Neurological:     Mental Status: She is alert.    Diagnostics: none  Assessment and Plan:   1. Acute non-recurrent pansinusitis   2. Essential hypertension    1.  Continue nasal fluticasone and azelastine  2.  Depo 40 IM delivered in clinic today  3.  Omnicef 300 mg 1 tablet twice a day for 14 days  4.  Can use nasal saline and antihistamine if needed  5.  Fix blood pressure aiming for < 120/80  6.  Further evaluation and treatment???  [place] will use a broad-spectrum antibiotic and a short course of systemic steroids in an attempt to eliminate any inflammation and infection of her upper airway.  She will keep in contact with me noting her response to this approach.  As well, she needs to fix her blood pressure issue as this is no doubt also contributing to some of her head issues and also her sensation of just not feeling well in general.  She will follow-up with her primary care doctor regarding further management of this hypertension issue.  Allena Katz, MD Allergy / Immun[facility] [place]

## 2021-12-05 NOTE — Patient Instructions (Addendum)
°  1.  Continue nasal fluticasone and azelastine  2.  Depo 40 IM delivered in clinic today  3.  Omnicef 300 mg 1 tablet twice a day for 14 days  4.  Can use nasal saline and antihistamine if needed  5.  Fix blood pressure aiming for < 120/80  6.  Further evaluation and treatment???

## 2021-12-06 ENCOUNTER — Encounter: Payer: Self-pay | Admitting: Allergy and Immunology

## 2021-12-14 ENCOUNTER — Telehealth: Payer: Self-pay

## 2021-12-14 MED ORDER — CLINDAMYCIN HCL 150 MG PO CAPS: 150.0000 mg | ORAL_CAPSULE | Freq: Three times a day (TID) | ORAL | 0 refills | Status: AC

## 2021-12-14 NOTE — Telephone Encounter (Signed)
It sounds as though she developed another infection, most likely viral, 9 days ago.  They could have been COVID or could have been influenza or some other virus.  We can have her run by the office to get a COVID swab.  If it is negative then we can change her antibiotic to clindamycin 150 mg 3 times per day for 14 days.  If she continues to have problems we may need to image her upper airway.

## 2021-12-14 NOTE — Telephone Encounter (Signed)
Patient saw you last week.  That evening, she started with scrathy throat.  Then a few days later she developed nasty, productive cough, and has noticed some wheezing as well.  The congestion in her head has gotten worse and she feels really bad, has some chest pain, some lightheadedness and lethargic.  She is taking the [facility] as prescribed and has been using Mucinex as well.  Please advise.

## 2021-12-14 NOTE — Addendum Note (Signed)
Addended by: Guy Franco on: 12/14/2021 02:47 PM   Modules accepted: Orders

## 2021-12-14 NOTE — Telephone Encounter (Signed)
Patient states she got tested for covid 2 times after she was last seen and it was negative.  Prescription for clindamycin 150 mg was sent in to [facility] on [address].   Patient informed and verbalized understanding

## 2022-02-07 ENCOUNTER — Telehealth: Payer: Self-pay | Admitting: Allergy and Immunology

## 2022-02-07 NOTE — Telephone Encounter (Signed)
Patient states she is experiencing very itchy eyes due to her allergies. She has tried using 2-3 OTC eye drops ([place]) but they do not work. She wondering if there are any prescribed eye drops that are covered by her insurance. She's scared she will get an infection in her eye since she tends to scratch them a lot.  ?

## 2022-02-07 NOTE — Telephone Encounter (Signed)
There are very few antihistamine eye drops that are actually covered by insurances now. Do you want her to try otc pataday?  ?

## 2022-02-08 NOTE — Telephone Encounter (Signed)
Renee Vaughn informed and she is going to try the Pataday.  ?

## 2023-01-30 ENCOUNTER — Encounter: Payer: Self-pay | Admitting: Gastroenterology

## 2023-01-30 ENCOUNTER — Other Ambulatory Visit: Payer: Self-pay

## 2023-01-30 ENCOUNTER — Ambulatory Visit: Payer: 59 | Admitting: Gastroenterology

## 2023-01-30 VITALS — BP 130/80 | HR 74 | Ht 67.0 in | Wt 193.0 lb

## 2023-01-30 DIAGNOSIS — Z83719 Family history of colon polyps, unspecified: Secondary | ICD-10-CM

## 2023-01-30 DIAGNOSIS — K449 Diaphragmatic hernia without obstruction or gangrene: Secondary | ICD-10-CM

## 2023-01-30 DIAGNOSIS — K219 Gastro-esophageal reflux disease without esophagitis: Secondary | ICD-10-CM

## 2023-01-30 DIAGNOSIS — R1011 Right upper quadrant pain: Secondary | ICD-10-CM

## 2023-01-30 DIAGNOSIS — K581 Irritable bowel syndrome with constipation: Secondary | ICD-10-CM

## 2023-01-30 MED ORDER — PANTOPRAZOLE SODIUM 40 MG PO TBEC: 40.0000 mg | DELAYED_RELEASE_TABLET | Freq: Every day | ORAL | 4 refills | Status: DC

## 2023-01-30 NOTE — Patient Instructions (Addendum)
_______________________________________________________  If your blood pressure at your visit was 140/90 or greater, please contact your primary care physician to follow up on this.  _______________________________________________________  If you are age 58 or older, your body mass index should be between 23-30. Your Body mass index is 30.23 kg/m. If this is out of the aforementioned range listed, please consider follow up with your Primary Care Provider.  If you are age 20 or younger, your body mass index should be between 19-25. Your Body mass index is 30.23 kg/m. If this is out of the aformentioned range listed, please consider follow up with your Primary Care Provider.   ________________________________________________________  The [facility] providers would like to encourage you to use [facility] to communicate with providers for non-urgent requests or questions.  Due to long hold times on the telephone, sending your provider a message by [facility] may be a faster and more efficient way to get a response.  Please allow 48 business hours for a response.  Please remember that this is for non-urgent requests.  _______________________________________________________  Your provider has requested that you go to the basement level for lab work before leaving today. Press "B" on the elevator. The lab is located at the first door on the left as you exit the elevator. Have to be off your PPI/acid reducers 2 weeks prior to collection  We have sent the following medications to your pharmacy for you to pick up at your convenience: Protonix  You have been scheduled for an abdominal ultrasound at [facility] (1st floor of hospital) on 02-14-2023 at 8am. Please arrive 30 minutes prior to your appointment for registration. Make certain not to have anything to eat or drink midnight prior to your appointment. Should you need to reschedule your appointment, please contact radiology at [phone]. This  test typically takes about 30 minutes to perform.  Please purchase the following medications over the counter and take as directed: Miralax 17g daily in [place] water  Repeat colonoscopy for 05-2024. Please call 2 months prior to schedule this. A letter will be sent as it gets closer.  You have been scheduled for an Upper GI Series at [facility]. Your appointment is on 02-14-23 at 9am. Please arrive 30 minutes prior to your test for registration. Make certain not to have anything to eat or drink after midnight on the night before your test. If you need to reschedule, please contact radiology at [phone]. --------------------------------------------------------------------------------------------------------------- An upper GI series uses x rays to help diagnose problems of the upper GI tract, which includes the esophagus, stomach, and duodenum. The duodenum is the first part of the small intestine. An upper GI series is conducted by a radiology technologist or a radiologist--a doctor who specializes in x-ray imaging--at a hospital or outpatient center. While sitting or standing in front of an x-ray machine, the patient drinks barium liquid, which is often white and has a chalky consistency and taste. The barium liquid coats the lining of the upper GI tract and makes signs of disease show up more clearly on x rays. X-ray video, called fluoroscopy, is used to view the barium liquid moving through the esophagus, stomach, and duodenum. Additional x rays and fluoroscopy are performed while the patient lies on an x-ray table. To fully coat the upper GI tract with barium liquid, the technologist or radiologist may press on the abdomen or ask the patient to change position. Patients hold still in various positions, allowing the technologist or radiologist to take x rays  of the upper GI tract at different angles. If a technologist conducts the upper GI series, a radiologist will later examine the images  to look for problems.  This test typically takes about 1 hour to complete --------------------------------------------------------------------------------------------------------------------------------------------- The Small Bowel Follow Thru examination is used to visualize the entire small bowel (intestines); specifically the connection between the small and large intestine. You will be positioned on a flat x-ray table and an image of your abdomen taken. Then the technologist will show the x-ray to the radiologist. The radiologist will instruct your technologist how much (1-2 cups) barium sulfate you will drink and when to begin taking the timed x-rays, usually 15-30 minutes after you begin drinking. Barium is a harmless substance that will highlight your small intestine by absorbing x-ray. The taste is chalky and it feels very heavy both in the cup and in your stomach.  After the first x-ray is taken and shown to the radiologist, he/she will determine when the next image is to be taken. This is repeated until the barium has reached the end of the small intestine and enters the beginning of the colon (cecum). At such time when the barium spills into the colon, you will be positioned on the x-ray table once again. The radiologist will use a fluoroscopic camera to take some detailed pictures of the connection between your small intestine and colon. The fluoroscope is an x-ray unit that works with a television/computer screen. The radiologist will apply pressure to your abdomen with his/her hand and a lead glove, a plastic paddle, or a paddle with an inflated rubber balloon on the end. This is to spread apart your loops of intestine so he/she can see all areas.   This test typically takes around 1 hour to complete.   Important  Drink plenty of water (8-10 cups/day) for a few days following the procedure to avoid constipation and blockage. The barium will make your stools white for a few  days. --------------------------------------------------------------------------------------------------------------------------------------------  Thank you,  Dr. Jackquline Denmark

## 2023-01-30 NOTE — Progress Notes (Signed)
Chief Complaint: Change in bowel habits  Referring Provider:  Raina Mina., MD      ASSESSMENT AND PLAN;   #1. GERD with HH. #2. RUQ pain.  Normal CBC, CMP and lipase. H/O biliary pancreatitis. ERCP 01/11/2016 at [facility]-biliary sphincterotomy s/p balloon sweep with extraction of biliary sludge.  Thereafter, she refused lap cholecystectomy as she was scared of anesthesia. #3. IBS-C. neg colon 05/2019 #3. FH colonic polyps (mom and GM >60). Neg colon 05/2019. Rpt in 5 yrs not 10 yrs.   Plan: - Continue protonix '40mg'$  po qd #90, 4 refills. - UGI series (she wants to hold off on EGD)  - [place] abdo - Stool antigen for HP - Continue Miralax 17g po qd. - Rpt colon 05/2024 d/t FH of colonic polyps.     HPI:    Renee Vaughn is a 57 y.o. female  With HTN (under care of cardiology), Hashimoto's thyroiditis, H/O biliary pancreatitis. ERCP 01/11/2016 at [facility]-biliary sphincterotomy s/p balloon sweep with extraction of biliary sludge.  Thereafter, she refused lap cholecystectomy as she was scared of anesthesia.  For follow-up visit  Flu Jan 2024 Then, nausea after eating epi pain/RUQ pain Seen by Kathrynn Ducking at Dr. Willette Pa office Nl CBC, CMP, lipase 01/18/2023  Continue reflux symptoms.  She wants to hold off on EGD and is willing to get upper GI series.  No odynophagia or dysphagia.  No diarrhea or contipation (on miralax).  Also would like to get H. pylori stool antigen tested.  No weight loss.  Denies having any jaundice dark urine or pale stools.    Wt Readings from Last 3 Encounters:  01/30/23 193 lb (87.5 kg)  06/30/21 192 lb 6.4 oz (87.3 kg)  09/17/20 202 lb 4 oz (91.7 kg)      Past GI procedures: Colonoscopy 06/19/2019  -Diminutive colonic polyp status post polypectomy. -Mild sigmoid diverticulosis. -Non-bleeding internal hemorrhoids. -Otherwise normal colonoscopy to TI. Bx-hyperplastic   Colonoscopy 04/09/2015 ([facility]) colonic polyp s/p polypectomy, mild sigmoid  diverticulosis.  Otherwise normal to TI. Bx- TA.  -H/O biliary pancreatitis. ERCP 01/11/2016 at [facility]-biliary sphincterotomy s/p balloon sweep with extraction of biliary sludge.  Thereafter, she refused lap cholecystectomy as she was scared of anesthesia.  -Upper GI series 03/28/2013: Small hiatal hernia Past Medical History:  Diagnosis Date   Anxiety    Colon polyps    Diverticulosis    Endometriosis    FH: colon polyps    Fibromyalgia    GERD (gastroesophageal reflux disease)    Hashimoto's disease    Hiatal hernia    History of colon polyps    History of Helicobacter pylori infection    Hypertension    IBS (irritable bowel syndrome)    Immune reconstitution syndrome ([facility])    Internal hemorrhoids    MVP (mitral valve prolapse)    Type 2 diabetes mellitus ([place])    no meds per pt    Past Surgical History:  Procedure Laterality Date   COLONOSCOPY  04/09/2015   Colonic polyp, status post polypectomy. Mild sigmoid diverticulosis. Small internal hemorrhoids. OTherwise normal colonoscopy.    episiotomy  1984   During childbirth.     Family History  Problem Relation Age of Onset   Asthma Mother    Diabetes Mother    High blood pressure Mother    COPD Mother    Prolactinoma Mother    Colon polyps Mother    Diabetes Father    High blood pressure Father  Asthma Sister    Atrial fibrillation Maternal Grandmother    Asthma Son    Asthma Son    Other Son        Eosinophilic Esophagitis   Other Half-Sister        removed laryx and thyroid   Throat cancer Half-Sister    Colon cancer Neg Hx    Esophageal cancer Neg Hx    Rectal cancer Neg Hx    Stomach cancer Neg Hx     Social History   Tobacco Use   Smoking status: Never   Smokeless tobacco: Never  Vaping Use   Vaping Use: Never used  Substance Use Topics   Alcohol use: Never   Drug use: Never    Current Outpatient Medications  Medication Sig Dispense Refill   Acetaminophen (TYLENOL PO) Take by mouth.      Aspirin-Acetaminophen-Caffeine (EXCEDRIN PO) Take by mouth.     azelastine (ASTELIN) 0.1 % nasal spray USE 1 SPRAY IN EACH NOSTRIL TWICE DAILY AS DIRECTED. 30 mL 11   Coenzyme Q10 (COQ10 PO) Take by mouth.     EPINEPHrine 0.3 mg/0.3 mL IJ SOAJ injection Use as directed for life-threatening allergic reaction. 1 each 3   famotidine (PEPCID) 20 MG tablet Take 20 mg by mouth as needed for heartburn or indigestion.     ferrous sulfate 325 (65 FE) MG EC tablet Take by mouth.     fluticasone (FLONASE) 50 MCG/ACT nasal spray Use one spray in each nostril twice daily 16 g 5   furosemide (LASIX) 20 MG tablet Take 10 mg by mouth.      IBUPROFEN PO Take by mouth.     levothyroxine (SYNTHROID) 88 MCG tablet Take 88 mcg by mouth daily.     meclizine (ANTIVERT) 25 MG tablet      Nutritional Supplements (NUTRITIONAL SUPPLEMENT PO) Take by mouth. Joint health supplement     pantoprazole (PROTONIX) 40 MG tablet Take 40 mg by mouth daily.     Probiotic Product (PROBIOTIC PO) Take by mouth.     propranolol ER (INDERAL LA) 120 MG 24 hr capsule Take 120 mg by mouth daily.     TURMERIC PO Take by mouth.     No current facility-administered medications for this visit.    Allergies  Allergen Reactions   Codeine Palpitations and Shortness Of Breath   Levofloxacin Shortness Of Breath, Palpitations and Other (See Comments)    Chest pain and vertigo DOES NOT TAKE FLUOROQUINOLONES   Alprazolam Other (See Comments)    Muscle weakness Muscle weakness Muscle weakness    Cyclobenzaprine Other (See Comments)    Muscle weakness Muscle weakness Muscle weakness    Diazepam Other (See Comments)    Muscle weakness Muscle weakness Muscle weakness    Ondansetron Other (See Comments) and Nausea And Vomiting    Causes disorientation/confusion Causes disorientation/confusion  Other reaction(s): Other (see comments) Causes disorientation/confusion Causes disorientation/confusion Causes  disorientation/confusion Causes disorientation/confusion Causes disorientation/confusion Causes disorientation/confusion Causes disorientation/confusion Causes disorientation/confusion    Dextromethorphan Other (See Comments)   Morphine And Related     Pt complained of palpitations, and chest pain. States that she just didn't feel well after taking it.   Moxifloxacin Hcl In Nacl Palpitations   Sulfa Antibiotics Palpitations    Review of Systems:  neg     Physical Exam:    Ht '5\' 7"'$  (1.702 m)   Wt 193 lb (87.5 kg)   BMI 30.23 kg/m  Filed Weights   01/30/23 1118  Weight: 193 lb (87.5 kg)  Gen: awake, alert, NAD HEENT: anicteric, no pallor CV: RRR, no mrg Pulm: CTA b/l Abd: soft, NT/ND, +BS throughout Ext: no c/c/e Neuro: nonfocal     Carmell Austria, MD 01/30/2023, 11:37 AM  Cc: Raina Mina., MD

## 2023-01-31 NOTE — Telephone Encounter (Signed)
Last I heard, endocrine was backed up.  Their first appointment was almost 8 to 12 months. Please get appointment with any endocrine RG

## 2023-02-07 ENCOUNTER — Encounter: Payer: Self-pay | Admitting: Gastroenterology

## 2023-02-07 ENCOUNTER — Telehealth: Payer: Self-pay | Admitting: Gastroenterology

## 2023-02-07 NOTE — Telephone Encounter (Signed)
Pt stated someone had sent her a my chart message with the answer to her questions; Pt verbalized understanding with all questions answered.

## 2023-02-07 NOTE — Telephone Encounter (Signed)
Inbound call from patient , she is currently not taken her medication "Pantoprazole" and said that she is not able to sleep or do much want to know if theres something else she can take until she goes back to her med .Please advise

## 2023-02-12 ENCOUNTER — Telehealth: Payer: Self-pay

## 2023-02-12 NOTE — Telephone Encounter (Signed)
PA request received via CMM for Pantoprazole Sodium 40MG  dr tablets  PA has been submitted to [facility] and is pending determination.  Key: [id number]

## 2023-02-14 ENCOUNTER — Other Ambulatory Visit: Payer: BC Managed Care – PPO

## 2023-02-14 ENCOUNTER — Ambulatory Visit (HOSPITAL_COMMUNITY)
Admission: RE | Admit: 2023-02-14 | Discharge: 2023-02-14 | Disposition: A | Discharge status: Home / Self Care | Payer: BC Managed Care – PPO | Source: Ambulatory Visit | Attending: Gastroenterology | Admitting: Gastroenterology

## 2023-02-14 DIAGNOSIS — K581 Irritable bowel syndrome with constipation: Secondary | ICD-10-CM | POA: Diagnosis present

## 2023-02-14 DIAGNOSIS — Z83719 Family history of colon polyps, unspecified: Secondary | ICD-10-CM

## 2023-02-14 DIAGNOSIS — R1011 Right upper quadrant pain: Secondary | ICD-10-CM | POA: Diagnosis not present

## 2023-02-14 DIAGNOSIS — K219 Gastro-esophageal reflux disease without esophagitis: Secondary | ICD-10-CM | POA: Diagnosis present

## 2023-02-14 DIAGNOSIS — K449 Diaphragmatic hernia without obstruction or gangrene: Secondary | ICD-10-CM

## 2023-02-14 NOTE — Telephone Encounter (Signed)
Pharmacy Patient Advocate Encounter  Received notification from [facility] that the request for prior authorization for PANTOPRAZOLE  has been denied due to .    Please be advised we currently do not have a Pharmacist to review denials, therefore you will need to process appeals accordingly as needed. Thanks for your support at this time.   You may call [phone] or fax [phone], to appeal.

## 2023-02-15 ENCOUNTER — Telehealth: Payer: Self-pay | Admitting: Gastroenterology

## 2023-02-15 ENCOUNTER — Other Ambulatory Visit: Payer: Self-pay

## 2023-02-15 NOTE — Telephone Encounter (Signed)
LVM for patient regarding her medications to see if she is having problems

## 2023-02-15 NOTE — Telephone Encounter (Signed)
Spoke to patient her PCP fills her Protonix but it usually goes to [facility] but I told her that if she needs it just let us know. She said for now the PCP can fill it

## 2023-02-15 NOTE — Telephone Encounter (Signed)
Patient has tried omeprazole according to a 09-2009 and nexium  from a 09-2008 note  and zantac from a 12-2015 note with Dr Lyndel Safe. He has notes in [place] from his old system. Can we see about an appeal since she has done 2 at least?

## 2023-02-15 NOTE — Telephone Encounter (Signed)
LVM for patient regarding her medications to see if she is having problems  No one called about her results. Dr Lyndel Safe needs to review them

## 2023-02-15 NOTE — Telephone Encounter (Signed)
PT is returning call. Would like to discuss results of ultrasound done on yesterday. Please advise.

## 2023-02-15 NOTE — Telephone Encounter (Signed)
But she is good on the medication. She has plenty

## 2023-02-15 NOTE — Telephone Encounter (Signed)
Please see note below. 

## 2023-02-15 NOTE — Telephone Encounter (Signed)
Pt stated that someone called her earlier this Am from our office. Pt stated that she thought it may have been in regard to her recent test results. No documentation of of anyone calling her.

## 2023-02-15 NOTE — Telephone Encounter (Signed)
PT is returning call. Please advise 

## 2023-02-16 LAB — H. PYLORI ANTIGEN, STOOL: H pylori Ag, Stl: NEGATIVE

## 2023-02-16 NOTE — Telephone Encounter (Signed)
Telephone note from [place] 01-18-2007 says   Renee Vaughn: February 04, 1967Married/ Language: Undefined/ Ethnicity: UndefinedFemaleThe patient is a 58 year old female.Assessment & Plan Renee Vaughn; 01/18/2007 10:09 AM)Epigastric Pain (789.06)negative RUQ USPlans:Nexium (40MG  Capsule DR 1 (one) Capsule DR PO qd, #30 Capsule DR, Ref. x11 #30 Capsule DR, 01/18/2007) Ordered.NOTE: Patient states the Prevacid and Over the counter medications not working for her. She stated that Dr. Neldon Mc gave her samples ofNexium and they seem to work really well for her. So I have called her in a prescription for this to [facility] in [place].Signed electronically by Renee Vaughn (01/18/2007 10:10 AM)

## 2023-02-16 NOTE — Telephone Encounter (Signed)
Pt would need to try all OTC alternative before coverage will be considered. (Prilosec, Nexium, and Prevacid)

## 2023-03-16 ENCOUNTER — Other Ambulatory Visit (HOSPITAL_COMMUNITY): Payer: Self-pay

## 2023-04-20 ENCOUNTER — Other Ambulatory Visit: Payer: Self-pay

## 2023-04-20 ENCOUNTER — Other Ambulatory Visit: Payer: Self-pay | Admitting: "Endocrinology

## 2023-04-20 DIAGNOSIS — E063 Autoimmune thyroiditis: Secondary | ICD-10-CM

## 2023-04-20 NOTE — Progress Notes (Signed)
Faxed to [facility] [phone]

## 2023-04-21 LAB — TSH
TSH: 2.87 (ref 0.41–5.90)
TSH: 2.87 u[IU]/mL (ref 0.450–4.500)

## 2023-04-21 LAB — T4, FREE: Free T4: 1.33 ng/dL (ref 0.82–1.77)

## 2023-04-24 ENCOUNTER — Other Ambulatory Visit: Payer: Self-pay

## 2023-04-27 ENCOUNTER — Ambulatory Visit: Payer: BC Managed Care – PPO | Admitting: "Endocrinology

## 2023-06-15 ENCOUNTER — Encounter: Payer: Self-pay | Admitting: "Endocrinology

## 2023-06-15 ENCOUNTER — Ambulatory Visit: Payer: BC Managed Care – PPO | Admitting: "Endocrinology

## 2023-06-15 VITALS — Ht 67.0 in | Wt 191.2 lb

## 2023-06-15 DIAGNOSIS — E039 Hypothyroidism, unspecified: Secondary | ICD-10-CM

## 2023-06-15 MED ORDER — LEVOTHYROXINE SODIUM 88 MCG PO TABS: 88.0000 ug | ORAL_TABLET | Freq: Every day | ORAL | 1 refills | Status: DC

## 2023-06-15 NOTE — Progress Notes (Signed)
Outpatient Endocrinology Note [name], MD  06/15/23   Renee Vaughn 07-25-1966 [id number]  Referring Provider: Audie Pinto, FNP Primary Care Provider: Audie Pinto, FNP Subjective  Chief Complaint  Patient presents with   Hashimoto's Thyroiditis    Assessment & Plan  [name] was seen today for hashimoto's thyroiditis.  Diagnoses and all orders for this visit:  Acquired hypothyroidism -     TSH Rfx on Abnormal to Free T4; Future  Other orders -     levothyroxine (SYNTHROID) 88 MCG tablet; Take 1 tablet (88 mcg total) by mouth daily.    MISBAH HORNADAY is currently taking levothyroxine 88 mcg qam. Patient is currently biochemically euthyroid.  Educated on thyroid axis.  Recommend the following: Take levothyroxine 88 mcg every morning.  Advised to take levothyroxine first thing in the morning on empty stomach and wait at least 30 minutes to 1 hour before eating or drinking anything or taking any other medications. Space out levothyroxine by 4 hours from any acid reflux medication/fibrate/iron/calcium/multivitamin. Advised to take birth control pills and nutritional supplements in the evening. Repeat lab before next visit or sooner if symptoms of hyperthyroidism or hypothyroidism develop.  Notify us immediately in case of significant weight gain or loss. Counseled on compliance and follow up needs.  I have reviewed current medications, nurse's notes, allergies, vital signs, past medical and surgical history, family medical history, and social history for this encounter. Counseled patient on symptoms, examination findings, lab findings, imaging results, treatment decisions and monitoring and prognosis. The patient understood the recommendations and agrees with the treatment plan. All questions regarding treatment plan were fully answered.   Return in about 4 months (around 10/16/2023) for visit + labs before next visit.   Altamese Inwood, MD  06/15/23   I  have reviewed current medications, nurse's notes, allergies, vital signs, past medical and surgical history, family medical history, and social history for this encounter. Counseled patient on symptoms, examination findings, lab findings, imaging results, treatment decisions and monitoring and prognosis. The patient understood the recommendations and agrees with the treatment plan. All questions regarding treatment plan were fully answered.   History of Present Illness Renee Vaughn is a 57 y.o. year old female who presents to our clinic with Hashimoto's thyroiditis diagnosed in 2022.    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 Clinical Activity Score: -/9  Physical Exam  Ht 5\' 7"  (1.702 m)   Wt 191 lb 3.2 oz (86.7 kg)   SpO2 98%   BMI 29.95 kg/m  Constitutional: well developed, well nourished Head: normocephalic, atraumatic, no exophthalmos Eyes: sclera anicteric, no redness Neck: no thyromegaly, no thyroid tenderness; no nodules palpated Lungs: normal respiratory effort Neurology: alert and oriented, no fine hand tremor Skin: dry, no appreciable rashes Musculoskeletal: no appreciable defects Psychiatric: normal mood and affect  Allergies Allergies  Allergen Reactions   Codeine Palpitations and Shortness Of Breath   Levofloxacin Shortness Of Breath, Palpitations and Other (See Comments)  Chest pain and vertigo DOES NOT TAKE FLUOROQUINOLONES    Alprazolam Other (See Comments)    Muscle weakness Muscle weakness Muscle weakness    Cyclobenzaprine Other (See Comments)    Muscle weakness Muscle weakness Muscle weakness    Diazepam Other (See Comments)    Muscle weakness Muscle weakness Muscle weakness    Ondansetron Other (See Comments) and Nausea And Vomiting    Causes disorientation/confusion Causes disorientation/confusion  Other reaction(s): Other (see comments) Causes disorientation/confusion Causes disorientation/confusion Causes disorientation/confusion Causes disorientation/confusion Causes disorientation/confusion Causes disorientation/confusion Causes disorientation/confusion Causes disorientation/confusion    Dextromethorphan Other (See Comments)   Morphine And Codeine     Pt complained of palpitations, and chest pain. States that she just didn't feel well after taking it.   Moxifloxacin Hcl In Nacl Palpitations   Sulfa Antibiotics Palpitations    Current Medications @EDPTMEDS @  Past Medical History Past Medical History:  Diagnosis Date   Anxiety    Colon polyps    Diverticulosis    Endometriosis    FH: colon polyps    Fibromyalgia    GERD (gastroesophageal reflux disease)    Hashimoto's disease    Hiatal hernia    History of colon polyps    History of Helicobacter pylori infection    Hypertension    IBS (irritable bowel syndrome)    Immune reconstitution syndrome (HCC)    Internal hemorrhoids    MVP (mitral valve prolapse)    Type 2 diabetes mellitus (HCC)    no meds per pt    Past Surgical History Past Surgical History:  Procedure Laterality Date   COLONOSCOPY  04/09/2015   Colonic polyp, status post polypectomy. Mild sigmoid diverticulosis. Small internal hemorrhoids. OTherwise normal colonoscopy.    episiotomy  1984   During childbirth.     Family History family history includes Asthma in her mother, sister, son, and son; Atrial fibrillation in her maternal grandmother; COPD  in her mother; Colon polyps in her mother; Diabetes in her father and mother; High blood pressure in her father and mother; Other in her half-sister and son; Prolactinoma in her mother; Throat cancer in her half-sister.  Social History Social History   Socioeconomic History   Marital status: Married    Spouse name: Not on file   Number of children: Not on file   Years of education: Not on file   Highest education level: Not on file  Occupational History   Not on file  Tobacco Use   Smoking status: Never   Smokeless tobacco: Never  Vaping Use   Vaping status: Never Used  Substance and Sexual Activity   Alcohol use: Never   Drug use: Never   Sexual activity: Not on file  Other Topics Concern   Not on file  Social History Narrative   Not on file   Social Determinants of Health   Financial Resource Strain: Low Risk  (06/08/2022)   [facility] to [facility]27/2023., [facility] visits prior to 01/27/2023., [facility]   Overall Financial Resource Strain (CARDIA)    Difficulty of Paying Living Expenses: Not very hard  Food Insecurity: Low Risk  (01/17/2023)   [facility] visits prior to 01/27/2023., [facility] visits prior to 01/27/2023.   Food    Within the past 12 months, you worried that your food would run out before you got money to buy more food: Never true  Within the past 12 months, the food you bought just didn't last and you didn't have money to get more: Never true  Transportation Needs: Not on file (01/17/2023)  Physical Activity: Unknown (06/08/2022)   [facility] prior to 01/27/2023[facility] visits prior to 01/27/2023., [facility]   Exercise Vital Sign    Days of Exercise per Week: Patient declined    Minutes of Exercise per Session: 0 min  Stress: Stress  Concern Present (06/08/2022)   [facility] visits prior to 01/27/2023., [facility] visits prior to 01/27/2023., [facility]   [employer] of Occupational Health - Occupational Stress Questionnaire    Feeling of Stress : To some extent  Social Connections: Socially Integrated (06/08/2022)   [facility] prior to 01/27/2023., [facility] visits prior to 01/27/2023., [facility]   Social Connection and Isolation Panel [NHANES]    Frequency of Communication with Friends and Family: More than three times a week    Frequency of Social Gatherings with Friends and Family: Once a week    Attends Religious Services: More than 4 times per year    Active Member of [employer] or Organizations: Yes    Attends Banker Meetings: More than 4 times per year    Marital Status: Married  Catering manager Violence: Not At Risk (06/08/2022)   [facility] prior to 01/27/2023., [facility] visits prior to 01/27/2023.   Humiliation, Afraid, Rape, and Kick questionnaire    Fear of Current or Ex-Partner: No    Emotionally Abused: No    Physically Abused: No    Sexually Abused: No    Laboratory Investigations Lab Results  Component Value Date   TSH 2.87 04/21/2023   TSH 2.870 04/20/2023   FREET4 1.33 04/20/2023     No results found for: "TSI"   No components found for: "TRAB"   No results found for: "CHOL" No results found for: "HDL" No results found for: "LDLCALC" No results found for: "TRIG" No results found for: "CHOLHDL" No results found for: "CREATININE" No results found for: "GFR" No results found for: "NA", "K", "CL", "CO2", "GLUCOSE", "BUN", "CREATININE", "CALCIUM", "PROT", "ALBUMIN", "AST", "ALT", "ALKPHOS", "BILITOT", "GFRNONAA", "GFRAA"     No data to display          No  results found for: "WBC", "RBC", "HGB", "HCT", "PLT", "MCV", "MCH", "MCHC", "RDW", "LYMPHSABS", "MONOABS", "EOSABS", "BASOSABS"    Parts of this note may have been dictated using voice recognition software. There may be variances in spelling and vocabulary which are unintentional. Not all errors are proofread. Please notify the Thereasa Parkin if any discrepancies are noted or if the meaning of any statement is not clear.

## 2023-10-05 ENCOUNTER — Other Ambulatory Visit: Payer: Self-pay | Admitting: Emergency Medicine

## 2023-10-05 DIAGNOSIS — E039 Hypothyroidism, unspecified: Secondary | ICD-10-CM

## 2023-10-05 DIAGNOSIS — E063 Autoimmune thyroiditis: Secondary | ICD-10-CM

## 2023-10-06 LAB — TSH

## 2023-10-06 LAB — T4, FREE: Free T4: 1.59 ng/dL (ref 0.82–1.77)

## 2023-10-06 LAB — TSH RFX ON ABNORMAL TO FREE T4: TSH: 4 u[IU]/mL (ref 0.450–4.500)

## 2023-10-12 ENCOUNTER — Other Ambulatory Visit: Payer: Self-pay

## 2023-10-12 ENCOUNTER — Encounter: Payer: Self-pay | Admitting: "Endocrinology

## 2023-10-12 ENCOUNTER — Ambulatory Visit: Payer: BC Managed Care – PPO | Admitting: "Endocrinology

## 2023-10-12 VITALS — BP 130/100 | HR 82 | Ht 67.0 in | Wt 199.0 lb

## 2023-10-12 DIAGNOSIS — E039 Hypothyroidism, unspecified: Secondary | ICD-10-CM | POA: Diagnosis not present

## 2023-10-12 DIAGNOSIS — I10 Essential (primary) hypertension: Secondary | ICD-10-CM

## 2023-10-12 MED ORDER — LEVOTHYROXINE SODIUM 88 MCG PO TABS: 88.0000 ug | ORAL_TABLET | Freq: Every day | ORAL | 1 refills | Status: DC

## 2023-10-12 NOTE — Progress Notes (Signed)
Outpatient Endocrinology Note Renee Trafalgar, MD  10/12/23   Renee Vaughn October 29, 1966 [id number]  Referring Provider: Audie Pinto, FNP Primary Care Provider: Audie Pinto, FNP Subjective  No chief complaint on file.  Assessment & Plan  Diagnoses and all orders for this visit:  Acquired hypothyroidism -     TSH Rfx on Abnormal to Free T4; Future  Hypertension, unspecified type  Other orders -     levothyroxine (SYNTHROID) 88 MCG tablet; Take 1 tablet (88 mcg total) by mouth daily.   Renee Vaughn is currently taking levothyroxine 88 mcg qam. Patient is currently biochemically euthyroid.  Educated on thyroid axis.  Recommend the following: Take levothyroxine 88 mcg every morning.  Advised to take levothyroxine first thing in the morning on empty stomach and wait at least 30 minutes to 1 hour before eating or drinking anything or taking any other medications. Space out levothyroxine by 4 hours from any acid reflux medication/fibrate/iron/calcium/multivitamin. Advised to take birth control pills and nutritional supplements in the evening. Repeat lab before next visit or sooner if symptoms of hyperthyroidism or hypothyroidism develop.  Notify us immediately in case of significant weight gain or loss. Counseled on compliance and follow up needs.  Reports fatigue but not interested in liothyronine due to palpitations and concern over S/E  C/o weight gain, but urinary cortisol low in 2022, ruling out Cushing's   BP noted high in clinic  On BP medications Instructed pt to check BP at home and contact PCP ASAP to address high diastolic BP    I have reviewed current medications, nurse's notes, allergies, vital signs, past medical and surgical history, family medical history, and social history for this encounter. Counseled patient on symptoms, examination findings, lab findings, imaging results, treatment decisions and monitoring and prognosis. The patient understood the  recommendations and agrees with the treatment plan. All questions regarding treatment plan were fully answered.   Return in about 4 months (around 02/09/2024) for visit + labs before next visit.   Renee Parks, MD  10/12/23   I have reviewed current medications, nurse's notes, allergies, vital signs, past medical and surgical history, family medical history, and social history for this encounter. Counseled patient on symptoms, examination findings, lab findings, imaging results, treatment decisions and monitoring and prognosis. The patient understood the recommendations and agrees with the treatment plan. All questions regarding treatment plan were fully answered.   History of Present Illness Renee Vaughn is a 57 y.o. year old female who presents to our clinic with Hashimoto's thyroiditis diagnosed in 2022.    C/o weight gain, high BP, fatigue, palpitations, hoarse voice intermittently, sweating On levothyroxine 88 mcg po qd  Initial history:  Symptoms suggestive of HYPOTHYROIDISM:  fatigue Yes weight gain No cold intolerance  No constipation  Yes, on miralax, chronic, before thyroid issues  Symptoms suggestive of HYPERTHYROIDISM:  weight loss  Yes heat intolerance No hyperdefecation  No palpitations  Yes, has cardiac history   Compressive symptoms:  dysphagia  No dysphonia  No positional dyspnea (especially with simultaneous arms elevation)  No  Smokes  No On biotin  No Personal history of head/neck surgery/irradiation  No   Physical Exam  BP (!) 130/100   Pulse 82   Ht 5\' 7"  (1.702 m)   Wt 199 lb (90.3 kg)   SpO2 98%   BMI 31.17 kg/m  Constitutional: well developed, well nourished Head: normocephalic, atraumatic, no exophthalmos Eyes: sclera anicteric, no redness Neck: no thyromegaly,  no thyroid tenderness; no nodules palpated Lungs: normal respiratory effort Neurology: alert and oriented, no fine hand tremor Skin: dry, no appreciable  rashes Musculoskeletal: no appreciable defects Psychiatric: normal mood and affect  Allergies Allergies  Allergen Reactions   Codeine Palpitations and Shortness Of Breath   Levofloxacin Shortness Of Breath, Palpitations and Other (See Comments)    Chest pain and vertigo DOES NOT TAKE FLUOROQUINOLONES   Alprazolam Other (See Comments)    Muscle weakness Muscle weakness Muscle weakness    Cyclobenzaprine Other (See Comments)    Muscle weakness Muscle weakness Muscle weakness    Diazepam Other (See Comments)    Muscle weakness Muscle weakness Muscle weakness    Ondansetron Other (See Comments) and Nausea And Vomiting    Causes disorientation/confusion Causes disorientation/confusion  Other reaction(s): Other (see comments) Causes disorientation/confusion Causes disorientation/confusion Causes disorientation/confusion Causes disorientation/confusion Causes disorientation/confusion Causes disorientation/confusion Causes disorientation/confusion Causes disorientation/confusion    Dextromethorphan Other (See Comments)   Morphine And Codeine     Pt complained of palpitations, and chest pain. States that she just didn't feel well after taking it.   Moxifloxacin Hcl In Nacl Palpitations   Sulfa Antibiotics Palpitations    Current Medications @EDPTMEDS @  Past Medical History Past Medical History:  Diagnosis Date   Anxiety    Colon polyps    Diverticulosis    Endometriosis    FH: colon polyps    Fibromyalgia    GERD (gastroesophageal reflux disease)    Hashimoto's disease    Hiatal hernia    History of colon polyps    History of Helicobacter pylori infection    Hypertension    IBS (irritable bowel syndrome)    Immune reconstitution syndrome (HCC)    Internal hemorrhoids    MVP (mitral valve prolapse)    Type 2 diabetes mellitus (HCC)    no meds per pt    Past Surgical History Past Surgical History:  Procedure Laterality Date   COLONOSCOPY  04/09/2015    Colonic polyp, status post polypectomy. Mild sigmoid diverticulosis. Small internal hemorrhoids. OTherwise normal colonoscopy.    episiotomy  1984   During childbirth.     Family History family history includes Asthma in her mother, sister, son, and son; Atrial fibrillation in her maternal grandmother; COPD in her mother; Colon polyps in her mother; Diabetes in her father and mother; High blood pressure in her father and mother; Other in her half-sister and son; Prolactinoma in her mother; Throat cancer in her half-sister.  Social History Social History   Socioeconomic History   Marital status: Married    Spouse name: Not on file   Number of children: Not on file   Years of education: Not on file   Highest education level: Not on file  Occupational History   Not on file  Tobacco Use   Smoking status: Never   Smokeless tobacco: Never  Vaping Use   Vaping status: Never Used  Substance and Sexual Activity   Alcohol use: Never   Drug use: Never   Sexual activity: Not on file  Other Topics Concern   Not on file  Social History Narrative   Not on file   Social Determinants of Health   Financial Resource Strain: Low Risk  (06/08/2022)   [facility] prior to 01/27/2023., [facility] visits prior to 01/27/2023., [facility]   Overall Financial Resource Strain (CARDIA)    Difficulty of Paying Living Expenses: Not very  hard  Food Insecurity: Low Risk  (01/17/2023)   [facility]   Hunger Vital Sign    Worried About Running Out of Food in the Last Year: Never true    Within the past 12 months, the food you bought just didn't last and you didn't have money to get more: Not on file  Transportation Needs: Not on file (01/17/2023)  Physical Activity: Unknown (06/08/2022)   [facility] visits prior to 01/27/2023., [facility] visits prior to 01/27/2023., [facility]   Exercise Vital Sign    Days of Exercise per Week: Patient declined    Minutes of Exercise per Session: 0 min  Stress: Stress Concern Present (06/08/2022)   [facility], [facility] visits prior to 01/27/2023., [facility] visits prior to 01/27/2023., [facility]   [employer] of Occupational Health - Occupational Stress Questionnaire    Feeling of Stress : To some extent  Social Connections: Socially Integrated (06/08/2022)   Received from [facility] visits prior to 01/27/2023., [facility] visits prior to 01/27/2023., [facility]   Social Connection and Isolation Panel [NHANES]    Frequency of Communication with Friends and Family: More than three times a week    Frequency of Social Gatherings with Friends and Family: Once a week    Attends Religious Services: More than 4 times per year    Active Member of [employer] or Organizations: Yes    Attends Banker Meetings: More than 4 times per year    Marital Status: Married  Catering manager Violence: Not At Risk (06/08/2022)   [facility] prior to 01/27/2023., [facility] visits prior to 01/27/2023.   Humiliation, Afraid, Rape, and Kick questionnaire    Fear of Current or Ex-Partner: No    Emotionally Abused: No    Physically Abused: No    Sexually Abused: No    Laboratory Investigations Lab Results  Component Value Date   TSH 4.000 10/05/2023   TSH CANCELED 10/05/2023   TSH 2.87 04/21/2023   FREET4 1.59 10/05/2023   FREET4 1.33 04/20/2023     No results found for: "TSI"   No components found for: "TRAB"   No results found for: "CHOL" No results found for: "HDL" No results found for: "LDLCALC" No results found for: "TRIG" No results found for: "CHOLHDL" No results found for:  "CREATININE" No results found for: "GFR" No results found for: "NA", "K", "CL", "CO2", "GLUCOSE", "BUN", "CREATININE", "CALCIUM", "PROT", "ALBUMIN", "AST", "ALT", "ALKPHOS", "BILITOT", "GFRNONAA", "GFRAA"     No data to display          No results found for: "WBC", "RBC", "HGB", "HCT", "PLT", "MCV", "MCH", "MCHC", "RDW", "LYMPHSABS", "MONOABS", "EOSABS", "BASOSABS"    Parts of this note may have been dictated using voice recognition software. There may be variances in spelling and vocabulary which are unintentional. Not all errors are proofread. Please notify the Thereasa Parkin if any discrepancies are noted or if the meaning of any statement is not clear.    Marland KitchenMarland Kitchen

## 2024-02-04 ENCOUNTER — Telehealth: Payer: Self-pay

## 2024-02-04 DIAGNOSIS — E063 Autoimmune thyroiditis: Secondary | ICD-10-CM

## 2024-02-04 NOTE — Telephone Encounter (Signed)
Faxed labs to [facility]

## 2024-02-09 LAB — TSH+FREE T4
Free T4: 1.49 ng/dL (ref 0.82–1.77)
TSH: 4.08 u[IU]/mL (ref 0.450–4.500)

## 2024-02-15 ENCOUNTER — Encounter: Payer: Self-pay | Admitting: "Endocrinology

## 2024-02-15 ENCOUNTER — Ambulatory Visit: Payer: BC Managed Care – PPO | Admitting: "Endocrinology

## 2024-02-15 VITALS — BP 130/80 | HR 75 | Ht 67.0 in | Wt 198.0 lb

## 2024-02-15 DIAGNOSIS — E063 Autoimmune thyroiditis: Secondary | ICD-10-CM | POA: Diagnosis not present

## 2024-02-15 MED ORDER — LEVOTHYROXINE SODIUM 88 MCG PO TABS: 88.0000 ug | ORAL_TABLET | Freq: Every day | ORAL | 1 refills | Status: DC

## 2024-02-15 NOTE — Addendum Note (Signed)
 Addended by: Altamese Chestnut on: 02/15/2024 11:26 AM   Modules accepted: Orders

## 2024-02-15 NOTE — Addendum Note (Signed)
 Addended by: Altamese Simla on: 02/15/2024 11:17 AM   Modules accepted: Orders

## 2024-02-15 NOTE — Progress Notes (Addendum)
 Outpatient Endocrinology Note [facility], MD  02/15/24   Renee Vaughn [birth year] [id number]  Referring Provider: Audie Pinto, FNP Primary Care Provider: Audie Pinto, FNP Subjective  No chief complaint on file.  Assessment & Plan  Diagnoses and all orders for this visit:  Hashimoto's disease -     Cancel: TSH(Reflex) -     TSH; Future -     TSH(Reflex)  Other orders -     levothyroxine (SYNTHROID) 88 MCG tablet; Take 1 tablet (88 mcg total) by mouth daily.   [name] is currently taking levothyroxine 88 mcg qam. Lives in [place], does labs here the day of visit Patient is currently biochemically euthyroid.  Educated on thyroid axis.  Recommend the following: Take levothyroxine 88 mcg every morning.  Advised to take levothyroxine first thing in the morning on empty stomach and wait at least 30 minutes to 1 hour before eating or drinking anything or taking any other medications. Space out levothyroxine by 4 hours from any acid reflux medication/fibrate/iron/calcium/multivitamin. Advised to take birth control pills and nutritional supplements in the evening. Repeat lab before next visit or sooner if symptoms of hyperthyroidism or hypothyroidism develop.  Notify us immediately in case of significant weight gain or loss. Counseled on compliance and follow up needs.  Reports fatigue but not interested in liothyronine due to palpitations and concern over S/E  Urinary cortisol low in 2022, ruling out Cushing's   I have reviewed current medications, nurse's notes, allergies, vital signs, past medical and surgical history, family medical history, and social history for this encounter. Counseled patient on symptoms, examination findings, lab findings, imaging results, treatment decisions and monitoring and prognosis. The patient understood the recommendations and agrees with the treatment plan. All questions regarding treatment plan were fully  answered.   Return in about 6 months (around 08/17/2024) for visit + labs on day of visit .   Renee [facility], MD  02/15/24   I have reviewed current medications, nurse's notes, allergies, vital signs, past medical and surgical history, family medical history, and social history for this encounter. Counseled patient on symptoms, examination findings, lab findings, imaging results, treatment decisions and monitoring and prognosis. The patient understood the recommendations and agrees with the treatment plan. All questions regarding treatment plan were fully answered.   History of Present Illness Renee Vaughn is a 58 y.o. year old female who presents to our clinic with Hashimoto's thyroiditis diagnosed in 2022.    C/o weight gain, high BP, fatigue, palpitations, hoarse voice intermittently, sweating On levothyroxine 88 mcg po qd  Initial history:  Symptoms suggestive of HYPOTHYROIDISM:  fatigue Yes weight gain No cold intolerance  Yes, varies between hot and cold, was diagnosed of dysautonomia  constipation  Yes, on miralax, chronic, before thyroid issues  Symptoms suggestive of HYPERTHYROIDISM:  weight loss  Yes heat intolerance Yes hyperdefecation  No palpitations  Yes, has cardiac history   Compressive symptoms:  dysphagia  No dysphonia  No positional dyspnea (especially with simultaneous arms elevation)  No  Smokes  No On biotin  No Personal history of head/neck surgery/irradiation  No   Physical Exam  BP 130/80   Pulse 75   Ht 5\' 7"  (1.702 m)   Wt 198 lb (89.8 kg)   SpO2 95%   BMI 31.01 kg/m  Constitutional: well developed, well nourished Head: normocephalic, atraumatic, no exophthalmos Eyes: sclera anicteric, no redness Neck: no thyromegaly, no thyroid tenderness; no nodules palpated Lungs: normal  respiratory effort Neurology: alert and oriented, no fine hand tremor Skin: dry, no appreciable rashes Musculoskeletal: no appreciable defects Psychiatric:  normal mood and affect  Allergies Allergies  Allergen Reactions   Codeine Palpitations and Shortness Of Breath   Levofloxacin Shortness Of Breath, Palpitations and Other (See Comments)    Chest pain and vertigo DOES NOT TAKE FLUOROQUINOLONES   Alprazolam Other (See Comments)    Muscle weakness Muscle weakness Muscle weakness    Cyclobenzaprine Other (See Comments)    Muscle weakness Muscle weakness Muscle weakness    Diazepam Other (See Comments)    Muscle weakness Muscle weakness Muscle weakness    Ondansetron Other (See Comments) and Nausea And Vomiting    Causes disorientation/confusion Causes disorientation/confusion  Other reaction(s): Other (see comments) Causes disorientation/confusion Causes disorientation/confusion Causes disorientation/confusion Causes disorientation/confusion Causes disorientation/confusion Causes disorientation/confusion Causes disorientation/confusion Causes disorientation/confusion    Dextromethorphan Other (See Comments)   Morphine And Codeine     Pt complained of palpitations, and chest pain. States that she just didn't feel well after taking it.   Moxifloxacin Hcl In Nacl Palpitations   Sulfa Antibiotics Palpitations    Current Medications @EDPTMEDS @  Past Medical History Past Medical History:  Diagnosis Date   Anxiety    Colon polyps    Diverticulosis    Endometriosis    FH: colon polyps    Fibromyalgia    GERD (gastroesophageal reflux disease)    Hashimoto's disease    Hiatal hernia    History of colon polyps    History of Helicobacter pylori infection    Hypertension    IBS (irritable bowel syndrome)    Immune reconstitution syndrome (HCC)    Internal hemorrhoids    MVP (mitral valve prolapse)    Type 2 diabetes mellitus (HCC)    no meds per pt    Past Surgical History Past Surgical History:  Procedure Laterality Date   COLONOSCOPY  04/09/2015   Colonic polyp, status post polypectomy. Mild sigmoid  diverticulosis. Small internal hemorrhoids. OTherwise normal colonoscopy.    episiotomy  1984   During childbirth.     Family History family history includes Asthma in her mother, sister, son, and son; Atrial fibrillation in her maternal grandmother; COPD in her mother; Colon polyps in her mother; Diabetes in her father and mother; High blood pressure in her father and mother; Other in her half-sister and son; Prolactinoma in her mother; Throat cancer in her half-sister.  Social History Social History   Socioeconomic History   Marital status: Married    Spouse name: Not on file   Number of children: Not on file   Years of education: Not on file   Highest education level: Not on file  Occupational History   Not on file  Tobacco Use   Smoking status: Never   Smokeless tobacco: Never  Vaping Use   Vaping status: Never Used  Substance and Sexual Activity   Alcohol use: Never   Drug use: Never   Sexual activity: Not on file  Other Topics Concern   Not on file  Social History Narrative   Not on file   Social Drivers of Health   Financial Resource Strain: Low Risk  (06/08/2022)   Received from [facility], [facility] visits prior to 01/27/2023., [facility] visits prior to 01/27/2023., [facility]   Overall Financial Resource Strain (CARDIA)    Difficulty of Paying Living Expenses: Not very hard  Food Insecurity: Low Risk  (01/31/2024)  [facility]   Hunger Vital Sign    Worried About Running Out of Food in the Last Year: Never true    Ran Out of Food in the Last Year: Never true  Transportation Needs: No Transportation Needs (01/31/2024)   Received from [facility]    In the past 12 months, has lack of reliable transportation kept you from medical appointments, meetings, work or from getting things needed for daily living? : No  Physical Activity: Unknown (06/08/2022)   [facility] prior to 01/27/2023., [facility], [facility] visits prior to 01/27/2023., [facility]   Exercise Vital Sign    Days of Exercise per Week: Patient declined    Minutes of Exercise per Session: 0 min  Stress: Stress Concern Present (06/08/2022)   [facility], [facility] prior to 01/27/2023., [facility] visits prior to 01/27/2023., [facility]   [employer] of Occupational Health - Occupational Stress Questionnaire    Feeling of Stress : To some extent  Social Connections: Socially Integrated (06/08/2022)   Received from [facility] visits prior to 01/27/2023., [facility], [facility] visits prior to 01/27/2023., [facility]   Social Connection and Isolation Panel [NHANES]    Frequency of Communication with Friends and Family: More than three times a week    Frequency of Social Gatherings with Friends and Family: Once a week    Attends Religious Services: More than 4 times per year    Active Member of [employer] or Organizations: Yes    Attends Banker Meetings: More than 4 times per year    Marital Status: Married  Catering manager Violence: Not At Risk (06/08/2022)   [facility] prior to 01/27/2023., [facility] visits prior to 01/27/2023.   Humiliation, Afraid, Rape, and Kick questionnaire    Fear of Current or Ex-Partner: No    Emotionally Abused: No    Physically Abused: No    Sexually Abused: No    Laboratory Investigations Lab Results  Component Value Date   TSH 4.080 02/08/2024   TSH 4.000 10/05/2023   TSH CANCELED 10/05/2023   FREET4 1.49 02/08/2024   FREET4 1.59 10/05/2023   FREET4 1.33 04/20/2023     No results found for: "TSI"   No components found for: "TRAB"   No results found for: "CHOL" No results found for: "HDL" No results found  for: "LDLCALC" No results found for: "TRIG" No results found for: "CHOLHDL" No results found for: "CREATININE" No results found for: "GFR" No results found for: "NA", "K", "CL", "CO2", "GLUCOSE", "BUN", "CREATININE", "CALCIUM", "PROT", "ALBUMIN", "AST", "ALT", "ALKPHOS", "BILITOT", "GFRNONAA", "GFRAA"     No data to display          No results found for: "WBC", "RBC", "HGB", "HCT", "PLT", "MCV", "MCH", "MCHC", "RDW", "LYMPHSABS", "MONOABS", "EOSABS", "BASOSABS"    Parts of this note may have been dictated using voice recognition software. There may be variances in spelling and vocabulary which are unintentional. Not all errors are proofread. Please notify the Thereasa Parkin if any discrepancies are noted or if the meaning of any statement is not clear.    Marland KitchenMarland Kitchen

## 2024-08-22 ENCOUNTER — Ambulatory Visit: Admitting: "Endocrinology

## 2024-08-22 ENCOUNTER — Encounter: Payer: Self-pay | Admitting: "Endocrinology

## 2024-08-22 VITALS — BP 124/70 | HR 75 | Resp 16 | Ht 67.0 in | Wt 202.6 lb

## 2024-08-22 DIAGNOSIS — E039 Hypothyroidism, unspecified: Secondary | ICD-10-CM

## 2024-08-22 NOTE — Progress Notes (Signed)
 Copy of future lab report given to patient as directed by MD for [employer]

## 2024-08-22 NOTE — Progress Notes (Signed)
 Outpatient Endocrinology Note [facility]e [facility], MD  08/22/24   Renee Vaughn 1966-08-30 [id number]  Referring Provider: Jackolyn Darice BROCKS, FNP Primary Care Provider: Jackolyn Darice BROCKS, FNP Subjective  No chief complaint on file.  Assessment & Plan  Diagnoses and all orders for this visit:  Acquired hypothyroidism -     TSH + free T4; Future   Renee Vaughn is currently taking levothyroxine  88 mcg qam. Lives in [place], does labs here the day of visit Patient is currently biochemically euthyroid.  Educated on thyroid axis.  Recommend the following: Take levothyroxine  88 mcg every morning. Lab today  Advised to take levothyroxine  first thing in the morning on empty stomach and wait at least 30 minutes to 1 hour before eating or drinking anything or taking any other medications. Space out levothyroxine  by 4 hours from any acid reflux medication/fibrate/iron/calcium/multivitamin. Advised to take birth control pills and nutritional supplements in the evening. Repeat lab before next visit or sooner if symptoms of hyperthyroidism or hypothyroidism develop.  Notify us  immediately in case of significant weight gain or loss. Counseled on compliance and follow up needs.  Reports fatigue but not interested in liothyronine due to palpitations and concern over S/E  Urinary cortisol low in 2022,which ruled out Cushing's   I have reviewed current medications, nurse's notes, allergies, vital signs, past medical and surgical history, family medical history, and social history for this encounter. Counseled patient on symptoms, examination findings, lab findings, imaging results, treatment decisions and monitoring and prognosis. The patient understood the recommendations and agrees with the treatment plan. All questions regarding treatment plan were fully answered.   Return in about 4 months (around 12/22/2024) for visit, labs today.   Renee [place], MD  08/22/24   I have reviewed current  medications, nurse's notes, allergies, vital signs, past medical and surgical history, family medical history, and social history for this encounter. Counseled patient on symptoms, examination findings, lab findings, imaging results, treatment decisions and monitoring and prognosis. The patient understood the recommendations and agrees with the treatment plan. All questions regarding treatment plan were fully answered.   History of Present Illness Renee Vaughn is a 58 y.o. year old female who presents to our clinic with Hashimoto's thyroiditis diagnosed in 2022.    C/o weight gain, high BP, fatigue, palpitations, hoarse voice intermittently, sweating On levothyroxine  88 mcg po qd  Initial history:  Symptoms suggestive of HYPOTHYROIDISM:  fatigue No weight gain Yes cold intolerance  No, varies between hot and cold, was diagnosed of dysautonomia  constipation  Yes, on miralax, chronic, before thyroid issues  Symptoms suggestive of HYPERTHYROIDISM:  weight loss  No heat intolerance Yes hyperdefecation  No palpitations  Yes, has cardiac history   Compressive symptoms:  dysphagia  No dysphonia  No positional dyspnea (especially with simultaneous arms elevation)  No  Smokes  No On biotin  Yes Personal history of head/neck surgery/irradiation  No   Physical Exam  BP 124/70   Pulse 75   Resp 16   Ht 5' 7 (1.702 m)   Wt 202 lb 9.6 oz (91.9 kg)   SpO2 97%   BMI 31.73 kg/m  Constitutional: well developed, well nourished Head: normocephalic, atraumatic, no exophthalmos Eyes: sclera anicteric, no redness Neck: no thyromegaly, no thyroid tenderness; no nodules palpated Lungs: normal respiratory effort Neurology: alert and oriented, no fine hand tremor Skin: dry, no appreciable rashes Musculoskeletal: no appreciable defects Psychiatric: normal mood and affect  Allergies Allergies  Allergen  Reactions   Codeine Palpitations and Shortness Of Breath   Levofloxacin Shortness  Of Breath, Palpitations and Other (See Comments)    Chest pain and vertigo DOES NOT TAKE FLUOROQUINOLONES   Alprazolam Other (See Comments)    Muscle weakness Muscle weakness Muscle weakness    Cyclobenzaprine Other (See Comments)    Muscle weakness Muscle weakness Muscle weakness    Diazepam Other (See Comments)    Muscle weakness Muscle weakness Muscle weakness    Ondansetron Other (See Comments) and Nausea And Vomiting    Causes disorientation/confusion Causes disorientation/confusion  Other reaction(s): Other (see comments) Causes disorientation/confusion Causes disorientation/confusion Causes disorientation/confusion Causes disorientation/confusion Causes disorientation/confusion Causes disorientation/confusion Causes disorientation/confusion Causes disorientation/confusion    Dextromethorphan Other (See Comments)   Morphine And Codeine     Pt complained of palpitations, and chest pain. States that she just didn't feel well after taking it.   Moxifloxacin Hcl In Nacl Palpitations   Sulfa Antibiotics Palpitations    Current Medications @EDPTMEDS @  Past Medical History Past Medical History:  Diagnosis Date   Anxiety    Colon polyps    Diverticulosis    Endometriosis    FH: colon polyps    Fibromyalgia    GERD (gastroesophageal reflux disease)    Hashimoto's disease    Hiatal hernia    History of colon polyps    History of Helicobacter pylori infection    Hypertension    IBS (irritable bowel syndrome)    Immune reconstitution syndrome    Internal hemorrhoids    MVP (mitral valve prolapse)    Type 2 diabetes mellitus (HCC)    no meds per pt    Past Surgical History Past Surgical History:  Procedure Laterality Date   COLONOSCOPY  04/09/2015   Colonic polyp, status post polypectomy. Mild sigmoid diverticulosis. Small internal hemorrhoids. OTherwise normal colonoscopy.    episiotomy  1984   During childbirth.     Family History family history  includes Asthma in her mother, sister, son, and son; Atrial fibrillation in her maternal grandmother; COPD in her mother; Colon polyps in her mother; Diabetes in her father and mother; High blood pressure in her father and mother; Other in her half-sister and son; Prolactinoma in her mother; Throat cancer in her half-sister.  Social History Social History   Socioeconomic History   Marital status: Married    Spouse name: Not on file   Number of children: Not on file   Years of education: Not on file   Highest education level: Not on file  Occupational History   Not on file  Tobacco Use   Smoking status: Never   Smokeless tobacco: Never  Vaping Use   Vaping status: Never Used  Substance and Sexual Activity   Alcohol use: Never   Drug use: Never   Sexual activity: Not on file  Other Topics Concern   Not on file  Social History Narrative   Not on file   Social Drivers of Health   Financial Resource Strain: Low Risk  (06/08/2022)   [facility], [facility] prior to 01/27/2023.   Overall Financial Resource Strain (CARDIA)    Difficulty of Paying Living Expenses: Not very hard  Food Insecurity: Low Risk  (07/11/2024)   [facility]   Hunger Vital Sign    Within the past 12 months, you worried that your food would run out before you got money to buy more: Never true    Within the  past 12 months, the food you bought just didn't last and you didn't have money to get more. : Never true  Transportation Needs: No Transportation Needs (07/11/2024)   Received from [facility]    In the past 12 months, has lack of reliable transportation kept you from medical appointments, meetings, work or from getting things needed for daily living? : No  Physical Activity: Unknown (06/08/2022)   [facility] visits prior to 01/27/2023., [facility]   Exercise Vital Sign    On average, how many  days per week do you engage in moderate to strenuous exercise (like a brisk walk)?: Patient declined    On average, how many minutes do you engage in exercise at this level?: 0 min  Stress: Stress Concern Present (06/08/2022)   [facility] from [facility], [facility] visits prior to 01/27/2023.   [employer] of Occupational Health - Occupational Stress Questionnaire    Feeling of Stress : To some extent  Social Connections: Socially Integrated (06/08/2022)   [facility] prior to 01/27/2023., [facility]   Social Connection and Isolation Panel    In a typical week, how many times do you talk on the phone with family, friends, or neighbors?: More than three times a week    How often do you get together with friends or relatives?: Once a week    How often do you attend church or religious services?: More than 4 times per year    Do you belong to any clubs or organizations such as church groups, unions, fraternal or athletic groups, or school groups?: Yes    How often do you attend meetings of the clubs or organizations you belong to?: More than 4 times per year    Are you married, widowed, divorced, separated, never married, or living with a partner?: Married  Intimate Partner Violence: Not At Risk (06/08/2022)   [facility] visits prior to 01/27/2023.   Humiliation, Afraid, Rape, and Kick questionnaire    Within the last year, have you been afraid of your partner or ex-partner?: No    Within the last year, have you been humiliated or emotionally abused in other ways by your partner or ex-partner?: No    Within the last year, have you been kicked, hit, slapped, or otherwise physically hurt by your partner or ex-partner?: No    Within the last year, have you been raped or forced to have any kind of sexual activity by your partner or ex-partner?: No    Laboratory Investigations Lab Results   Component Value Date   TSH 4.080 02/08/2024   TSH 4.000 10/05/2023   TSH CANCELED 10/05/2023   FREET4 1.49 02/08/2024   FREET4 1.59 10/05/2023   FREET4 1.33 04/20/2023     No results found for: TSI   No components found for: TRAB   No results found for: CHOL No results found for: HDL No results found for: LDLCALC No results found for: TRIG No results found for: CHOLHDL No results found for: CREATININE No results found for: GFR No results found for: NA, K, CL, CO2, GLUCOSE, BUN, CREATININE, CALCIUM, PROT, ALBUMIN, AST, ALT, ALKPHOS, BILITOT, GFRNONAA, GFRAA     No data to display          No results found for: WBC, RBC, HGB, HCT, PLT, MCV, MCH, MCHC, RDW, LYMPHSABS, MONOABS, EOSABS, BASOSABS  Parts of this note may have been dictated using voice recognition software. There may be variances in spelling and vocabulary which are unintentional. Not all errors are proofread. Please notify the dino if any discrepancies are noted or if the meaning of any statement is not clear.    SABRASABRA

## 2024-08-25 LAB — REFLEX TIQ

## 2024-08-25 LAB — TSH(REFL): TSH: 2.08 m[IU]/L (ref 0.40–4.50)

## 2024-12-12 ENCOUNTER — Ambulatory Visit (HOSPITAL_BASED_OUTPATIENT_CLINIC_OR_DEPARTMENT_OTHER): Admission: RE | Admit: 2024-12-12 | Discharge: 2024-12-12 | Disposition: A | Discharge status: Home / Self Care

## 2024-12-12 ENCOUNTER — Encounter (HOSPITAL_BASED_OUTPATIENT_CLINIC_OR_DEPARTMENT_OTHER): Payer: Self-pay

## 2024-12-12 ENCOUNTER — Ambulatory Visit (HOSPITAL_BASED_OUTPATIENT_CLINIC_OR_DEPARTMENT_OTHER): Admit: 2024-12-12 | Discharge: 2024-12-12 | Disposition: A | Discharge status: Home / Self Care | Admitting: Radiology

## 2024-12-12 VITALS — BP 168/88 | HR 81 | Temp 99.7°F | Resp 20

## 2024-12-12 DIAGNOSIS — J208 Acute bronchitis due to other specified organisms: Secondary | ICD-10-CM | POA: Diagnosis not present

## 2024-12-12 DIAGNOSIS — J0140 Acute pansinusitis, unspecified: Secondary | ICD-10-CM | POA: Diagnosis not present

## 2024-12-12 DIAGNOSIS — R062 Wheezing: Secondary | ICD-10-CM

## 2024-12-12 DIAGNOSIS — R051 Acute cough: Secondary | ICD-10-CM

## 2024-12-12 DIAGNOSIS — R059 Cough, unspecified: Secondary | ICD-10-CM

## 2024-12-12 MED ORDER — AIRSUPRA 90-80 MCG/ACT IN AERO: 2.0000 | INHALATION_SPRAY | RESPIRATORY_TRACT | 0 refills | Status: AC | PRN

## 2024-12-12 MED ORDER — PREDNISONE 20 MG PO TABS: 20.0000 mg | ORAL_TABLET | Freq: Every day | ORAL | 0 refills | Status: AC

## 2024-12-12 MED ORDER — DOXYCYCLINE HYCLATE 100 MG PO CAPS: 100.0000 mg | ORAL_CAPSULE | Freq: Two times a day (BID) | ORAL | 0 refills | Status: AC

## 2024-12-12 NOTE — Discharge Instructions (Addendum)
 Sinusitis and viral bronchitis with cough: Discussed testing for flu, COVID, RSV.  Due to duration of symptoms patient is beyond the time when she could get treatment for COVID or the flu.  There is not a specific medication for RSV.  Testing not done.  Airsupra  inhaler, 2 puffs, every 4 hours if needed for wheezing.  Doxycycline  100 mg twice daily for 10 days.  Prednisone  20 mg daily for 5 days and start that tomorrow in the morning.  Patient will use OTC cough syrup if needed.  Get plenty of fluids and rest.  Encouraged sinus rinses to reduce any sinus pressure and get some of the mucus and discharge out.  Follow-up with primary care if symptoms do not improve, worsen or if new symptoms occur.  Follow-up here as needed

## 2024-12-12 NOTE — ED Triage Notes (Signed)
 Patient states she has been ill since Monday. Reports that he spouse had been seen here recently for cough, cold, flu like symptoms. He was not diagnosed with anything but patient states she has now become sick with cough, wheezing. Has chronic sinusitis. Has an inhaler at home and states that is not helping. States that her spouse's place of work has had many employees out with RSV.  +wheezing to upper lungs. Patient has significant cardiac hx.

## 2024-12-12 NOTE — ED Provider Notes (Signed)
 " [facility]    CSN: [id number] Arrival date & time: 12/12/24  1758      History   Chief Complaint Chief Complaint  Patient presents with   Cough    Wheezing, chills, fever and weakness - Entered by patient    HPI Renee Vaughn is a 59 y.o. female.   59 year old female who reports she has been ill since the evening of 12/07/2024.  She has had a runny nose, cough, congestion, body aches.  Her husband has had similar symptoms.  His work or plant has had lots of people with RSV pneumonia.  She has many chronic health conditions including hypertension and diabetes.  She is concerned that she might be getting pneumonia.  She is having cough and shortness of breath and wheezing.  She denies fever.  She has really diet controlled diabetes and has tolerated prednisone  and would like to consider that if needed.  She has an albuterol inhaler at home but it does not seem to be helping her symptoms.   Cough Associated symptoms: rhinorrhea, shortness of breath and wheezing   Associated symptoms: no chest pain, no chills, no ear pain, no fever, no rash and no sore throat     Past Medical History:  Diagnosis Date   Anxiety    Colon polyps    Diverticulosis    Endometriosis    FH: colon polyps    Fibromyalgia    GERD (gastroesophageal reflux disease)    Hashimoto's disease    Hiatal hernia    History of colon polyps    History of Helicobacter pylori infection    Hypertension    IBS (irritable bowel syndrome)    Immune reconstitution syndrome    Internal hemorrhoids    MVP (mitral valve prolapse)    Type 2 diabetes mellitus (HCC)    no meds per pt    Patient Active Problem List   Diagnosis Date Noted   Other irritable bowel syndrome 06/19/2019    Past Surgical History:  Procedure Laterality Date   COLONOSCOPY  04/09/2015   Colonic polyp, status post polypectomy. Mild sigmoid diverticulosis. Small internal hemorrhoids. OTherwise normal colonoscopy.    episiotomy   1984   During childbirth.     OB History   No obstetric history on file.      Home Medications    Prior to Admission medications  Medication Sig Start Date End Date Taking? Authorizing Provider  albuterol (VENTOLIN HFA) 108 (90 Base) MCG/ACT inhaler Inhale 2 puffs into the lungs every 4 (four) hours as needed for wheezing. 12/27/22  Yes [provider]  Albuterol-Budesonide (AIRSUPRA ) 90-80 MCG/ACT AERO Inhale 2 puffs into the lungs every 4 (four) hours as needed (wheezing.  Rinse mouth after use). 12/12/24  Yes Ival Domino, FNP  doxycycline  (VIBRAMYCIN ) 100 MG capsule Take 1 capsule (100 mg total) by mouth 2 (two) times daily for 10 days. 12/12/24 12/22/24 Yes Ival Domino, FNP  predniSONE  (DELTASONE ) 20 MG tablet Take 1 tablet (20 mg total) by mouth daily with breakfast for 5 days. 12/12/24 12/17/24 Yes Ival Domino, FNP  Acetaminophen (TYLENOL PO) Take by mouth.    [provider]  Aspirin-Acetaminophen-Caffeine (EXCEDRIN PO) Take by mouth.    [provider]  azelastine  (ASTELIN ) 0.1 % nasal spray USE 1 SPRAY IN EACH NOSTRIL TWICE DAILY AS DIRECTED. 12/05/21   Kozlow, Camellia PARAS, MD  Coenzyme Q10 (COQ10 PO) Take by mouth.    [provider]  diltiazem (CARDIZEM CD) 120  MG 24 hr capsule Take 120 mg by mouth daily.    [provider]  EPINEPHrine  0.3 mg/0.3 mL IJ SOAJ injection Use as directed for life-threatening allergic reaction. 05/07/20   Kozlow, Camellia PARAS, MD  famotidine (PEPCID) 20 MG tablet Take 20 mg by mouth as needed for heartburn or indigestion.    [provider]  ferrous sulfate 325 (65 FE) MG EC tablet Take by mouth.    [provider]  fluticasone  (FLONASE ) 50 MCG/ACT nasal spray Use one spray in each nostril twice daily 04/30/20   Kozlow, Eric J, MD  furosemide (LASIX) 20 MG tablet Take 10 mg by mouth.  03/29/20   [provider]  IBUPROFEN PO Take by mouth.    [provider]  labetalol (NORMODYNE) 200  MG tablet Take 200 mg by mouth 2 (two) times daily. 12/28/21   [provider]  levothyroxine  (SYNTHROID ) 88 MCG tablet Take 1 tablet (88 mcg total) by mouth daily. 02/15/24   Motwani, Komal, MD  meclizine (ANTIVERT) 25 MG tablet  05/22/16   [provider]  Nutritional Supplements (NUTRITIONAL SUPPLEMENT PO) Take by mouth. Joint health supplement    [provider]  olmesartan (BENICAR) 5 MG tablet Take 5 mg by mouth 2 (two) times daily.    [provider]  pantoprazole  (PROTONIX ) 40 MG tablet Take 40 mg by mouth daily. 09/14/21   [provider]  Probiotic Product (PROBIOTIC PO) Take by mouth.    [provider]  TURMERIC PO Take by mouth.    [provider]    Family History Family History  Problem Relation Age of Onset   Asthma Mother    Diabetes Mother    High blood pressure Mother    COPD Mother    Prolactinoma Mother    Colon polyps Mother    Diabetes Father    High blood pressure Father    Asthma Sister    Atrial fibrillation Maternal Grandmother    Asthma Son    Asthma Son    Other Son        Eosinophilic Esophagitis   Other Half-Sister        removed laryx and thyroid   Throat cancer Half-Sister    Colon cancer Neg Hx    Esophageal cancer Neg Hx    Rectal cancer Neg Hx    Stomach cancer Neg Hx     Social History Social History[1]   Allergies   Codeine, Levofloxacin, Alprazolam, Cyclobenzaprine, Diazepam, Ondansetron, Dextromethorphan, Morphine and codeine, Moxifloxacin hcl in nacl, and Sulfa antibiotics   Review of Systems Review of Systems  Constitutional:  Positive for fatigue. Negative for chills and fever.  HENT:  Positive for congestion, postnasal drip, rhinorrhea, sinus pressure and sinus pain. Negative for ear pain and sore throat.   Eyes:  Negative for pain and visual disturbance.  Respiratory:  Positive for cough, shortness of breath and wheezing.   Cardiovascular:  Negative for chest pain  and palpitations.  Gastrointestinal:  Negative for abdominal pain, constipation, diarrhea, nausea and vomiting.  Genitourinary:  Negative for dysuria and hematuria.  Musculoskeletal:  Positive for arthralgias. Negative for back pain.  Skin:  Negative for color change and rash.  Neurological:  Negative for seizures and syncope.  All other systems reviewed and are negative.    Physical Exam Triage Vital Signs ED Triage Vitals  Encounter Vitals Group     BP 12/12/24 1837 (!) 168/88     Girls Systolic BP Percentile --  Girls Diastolic BP Percentile --      Boys Systolic BP Percentile --      Boys Diastolic BP Percentile --      Pulse Rate 12/12/24 1837 81     Resp 12/12/24 1837 20     Temp 12/12/24 1837 99.7 F (37.6 C)     Temp Source 12/12/24 1837 Oral     SpO2 12/12/24 1837 96 %     Weight --      Height --      Head Circumference --      Peak Flow --      Pain Score 12/12/24 1840 0     Pain Loc --      Pain Education --      Exclude from Growth Chart --    No data found.  Updated Vital Signs BP (!) 168/88 (BP Location: Right Arm)   Pulse 81   Temp 99.7 F (37.6 C) (Oral)   Resp 20   SpO2 96%   Visual Acuity Right Eye Distance:   Left Eye Distance:   Bilateral Distance:    Right Eye Near:   Left Eye Near:    Bilateral Near:     Physical Exam Vitals and nursing note reviewed.  Constitutional:      General: She is not in acute distress.    Appearance: She is well-developed. She is ill-appearing. She is not toxic-appearing or diaphoretic.  HENT:     Head: Normocephalic and atraumatic.     Right Ear: Hearing, tympanic membrane, ear canal and external ear normal.     Left Ear: Hearing, tympanic membrane, ear canal and external ear normal.     Nose: Congestion and rhinorrhea present. Rhinorrhea is clear.     Right Sinus: Maxillary sinus tenderness and frontal sinus tenderness present.     Left Sinus: Maxillary sinus tenderness and frontal sinus tenderness  present.     Mouth/Throat:     Lips: Pink.     Mouth: Mucous membranes are moist.     Pharynx: Uvula midline. No oropharyngeal exudate or posterior oropharyngeal erythema.     Tonsils: No tonsillar exudate.  Eyes:     Conjunctiva/sclera: Conjunctivae normal.     Pupils: Pupils are equal, round, and reactive to light.  Cardiovascular:     Rate and Rhythm: Normal rate and regular rhythm.     Heart sounds: S1 normal and S2 normal. No murmur heard. Pulmonary:     Effort: Pulmonary effort is normal. No respiratory distress.     Breath sounds: Examination of the right-upper field reveals wheezing. Examination of the left-upper field reveals wheezing. Examination of the right-middle field reveals wheezing. Examination of the left-middle field reveals wheezing. Examination of the right-lower field reveals wheezing. Examination of the left-lower field reveals wheezing. Wheezing (Mild, intermittent inspiratory wheezes throughout) present. No decreased breath sounds, rhonchi or rales.  Abdominal:     General: Bowel sounds are normal.     Palpations: Abdomen is soft.     Tenderness: There is no abdominal tenderness.  Musculoskeletal:        General: No swelling.     Cervical back: Neck supple.  Lymphadenopathy:     Head:     Right side of head: No submental, submandibular, tonsillar, preauricular or posterior auricular adenopathy.     Left side of head: No submental, submandibular, tonsillar, preauricular or posterior auricular adenopathy.     Cervical: Cervical adenopathy present.     Right cervical: Superficial cervical  adenopathy present.     Left cervical: Superficial cervical adenopathy present.  Skin:    General: Skin is warm and dry.     Capillary Refill: Capillary refill takes less than 2 seconds.     Findings: No rash.  Neurological:     Mental Status: She is alert and oriented to person, place, and time.  Psychiatric:        Mood and Affect: Mood normal.      UC Treatments /  Results  Labs (all labs ordered are listed, but only abnormal results are displayed) Labs Reviewed - No data to display  EKG   Radiology DG Chest 2 View Result Date: 12/12/2024 CLINICAL DATA:  Cough and wheezing EXAM: DG CHEST 2V COMPARISON:  05/12/2021 FINDINGS: The heart size and mediastinal contours are within normal limits. Both lungs are clear. The visualized skeletal structures are unremarkable. IMPRESSION: No active cardiopulmonary disease. Electronically Signed   By: Luke Bun M.D.   On: 12/12/2024 19:20    Procedures Procedures (including critical care time)  Medications Ordered in UC Medications - No data to display  Initial Impression / Assessment and Plan / UC Course  I have reviewed the triage vital signs and the nursing notes.  Pertinent labs & imaging results that were available during my care of the patient were reviewed by me and considered in my medical decision making (see chart for details).  Plan of Care (see discharge instructions for additional patient precautions and education): Sinusitis and viral bronchitis with cough: Discussed testing for flu, COVID, RSV.  Due to duration of symptoms patient is beyond the time when she could get treatment for COVID or the flu.  There is not a specific medication for RSV.  Testing not done.  Airsupra  inhaler, 2 puffs, every 4 hours if needed for wheezing.  Doxycycline  100 mg twice daily for 10 days.  Prednisone  20 mg daily for 5 days and start that tomorrow in the morning.  Patient will use OTC cough syrup if needed.  Get plenty of fluids and rest.  Encouraged sinus rinses to reduce any sinus pressure and get some of the mucus and discharge out.  Follow-up with primary care if symptoms do not improve, worsen or if new symptoms occur.  Follow-up here as needed.  I reviewed the plan of care with the patient and/or the patient's guardian.  The patient and/or guardian had time to ask questions and acknowledged that the  questions were answered.  Final Clinical Impressions(s) / UC Diagnoses   Final diagnoses:  Acute viral bronchitis  Acute cough  Acute non-recurrent pansinusitis     Discharge Instructions      Sinusitis and viral bronchitis with cough: Discussed testing for flu, COVID, RSV.  Due to duration of symptoms patient is beyond the time when she could get treatment for COVID or the flu.  There is not a specific medication for RSV.  Testing not done.  Airsupra  inhaler, 2 puffs, every 4 hours if needed for wheezing.  Doxycycline  100 mg twice daily for 10 days.  Prednisone  20 mg daily for 5 days and start that tomorrow in the morning.  Patient will use OTC cough syrup if needed.  Get plenty of fluids and rest.  Encouraged sinus rinses to reduce any sinus pressure and get some of the mucus and discharge out.  Follow-up with primary care if symptoms do not improve, worsen or if new symptoms occur.  Follow-up here as needed     ED Prescriptions  Medication Sig Dispense Auth. Provider   predniSONE  (DELTASONE ) 20 MG tablet Take 1 tablet (20 mg total) by mouth daily with breakfast for 5 days. 5 tablet Capricia Serda, FNP   doxycycline  (VIBRAMYCIN ) 100 MG capsule Take 1 capsule (100 mg total) by mouth 2 (two) times daily for 10 days. 20 capsule Ival Domino, FNP   Albuterol-Budesonide (AIRSUPRA ) 90-80 MCG/ACT AERO Inhale 2 puffs into the lungs every 4 (four) hours as needed (wheezing.  Rinse mouth after use). 10.7 g Ival Domino, FNP      PDMP not reviewed this encounter.     [1]  Social History Tobacco Use   Smoking status: Never   Smokeless tobacco: Never  Vaping Use   Vaping status: Never Used  Substance Use Topics   Alcohol use: Never   Drug use: Never     Ival Domino, FNP 12/12/24 2133  "

## 2024-12-19 ENCOUNTER — Encounter: Payer: Self-pay | Admitting: "Endocrinology

## 2024-12-19 ENCOUNTER — Telehealth: Payer: Self-pay | Admitting: "Endocrinology

## 2024-12-19 MED ORDER — LEVOTHYROXINE SODIUM 88 MCG PO TABS: 88.0000 ug | ORAL_TABLET | Freq: Every day | ORAL | 1 refills | Status: AC

## 2024-12-19 NOTE — Telephone Encounter (Signed)
 Patient called to update her pharmacy information,she needs all new prescrictions sent to the [facility] [address]  [place] [id number]

## 2024-12-26 ENCOUNTER — Telehealth: Admitting: "Endocrinology

## 2025-01-16 ENCOUNTER — Telehealth: Admitting: "Endocrinology
